# Patient Record
Sex: Female | Born: 1980 | Hispanic: No | Marital: Single | Smoking: Current every day smoker
Health system: Southern US, Community
[De-identification: ages and names within clinical notes are randomized; demographics above are authoritative.]

## PROBLEM LIST (undated history)

## (undated) ENCOUNTER — Inpatient Hospital Stay (HOSPITAL_COMMUNITY): Payer: Self-pay

## (undated) HISTORY — PX: TONSILLECTOMY: SUR1361

## (undated) HISTORY — PX: CHOLECYSTECTOMY: SHX55

---

## 2015-04-03 ENCOUNTER — Emergency Department (HOSPITAL_COMMUNITY): Payer: BLUE CROSS/BLUE SHIELD

## 2015-04-03 ENCOUNTER — Encounter (HOSPITAL_COMMUNITY): Payer: Self-pay | Admitting: Emergency Medicine

## 2015-04-03 ENCOUNTER — Inpatient Hospital Stay (HOSPITAL_COMMUNITY)
Admission: AD | Admit: 2015-04-03 | Discharge: 2015-04-04 | Disposition: A | Discharge status: Other Acute Inpatient Hospital | Payer: BLUE CROSS/BLUE SHIELD | Source: Ambulatory Visit | Attending: Emergency Medicine | Admitting: Emergency Medicine

## 2015-04-03 DIAGNOSIS — Z3490 Encounter for supervision of normal pregnancy, unspecified, unspecified trimester: Secondary | ICD-10-CM

## 2015-04-03 DIAGNOSIS — B9689 Other specified bacterial agents as the cause of diseases classified elsewhere: Secondary | ICD-10-CM

## 2015-04-03 DIAGNOSIS — N760 Acute vaginitis: Secondary | ICD-10-CM

## 2015-04-03 DIAGNOSIS — O009 Unspecified ectopic pregnancy without intrauterine pregnancy: Secondary | ICD-10-CM

## 2015-04-03 DIAGNOSIS — F17210 Nicotine dependence, cigarettes, uncomplicated: Secondary | ICD-10-CM | POA: Diagnosis not present

## 2015-04-03 DIAGNOSIS — O23591 Infection of other part of genital tract in pregnancy, first trimester: Secondary | ICD-10-CM | POA: Diagnosis not present

## 2015-04-03 DIAGNOSIS — O9989 Other specified diseases and conditions complicating pregnancy, childbirth and the puerperium: Secondary | ICD-10-CM | POA: Diagnosis present

## 2015-04-03 DIAGNOSIS — Z79899 Other long term (current) drug therapy: Secondary | ICD-10-CM | POA: Diagnosis not present

## 2015-04-03 DIAGNOSIS — Z9049 Acquired absence of other specified parts of digestive tract: Secondary | ICD-10-CM | POA: Diagnosis not present

## 2015-04-03 DIAGNOSIS — R109 Unspecified abdominal pain: Secondary | ICD-10-CM

## 2015-04-03 DIAGNOSIS — Z3A01 Less than 8 weeks gestation of pregnancy: Secondary | ICD-10-CM | POA: Diagnosis not present

## 2015-04-03 LAB — COMPREHENSIVE METABOLIC PANEL
ALBUMIN: 4.4 g/dL (ref 3.5–5.0)
ALT: 23 U/L (ref 14–54)
AST: 25 U/L (ref 15–41)
Alkaline Phosphatase: 64 U/L (ref 38–126)
Anion gap: 7 (ref 5–15)
BILIRUBIN TOTAL: 0.4 mg/dL (ref 0.3–1.2)
BUN: 16 mg/dL (ref 6–20)
CALCIUM: 8.7 mg/dL — AB (ref 8.9–10.3)
CO2: 27 mmol/L (ref 22–32)
Chloride: 104 mmol/L (ref 101–111)
Creatinine, Ser: 0.63 mg/dL (ref 0.44–1.00)
GFR calc Af Amer: >60 mL/min (ref >60)
GFR calc non Af Amer: >60 mL/min (ref >60)
Glucose, Bld: 107 mg/dL — ABNORMAL HIGH (ref 65–99)
POTASSIUM: 4.1 mmol/L (ref 3.5–5.1)
SODIUM: 138 mmol/L (ref 135–145)
TOTAL PROTEIN: 7.8 g/dL (ref 6.5–8.1)

## 2015-04-03 LAB — URINALYSIS, ROUTINE W REFLEX MICROSCOPIC
Bilirubin Urine: NEGATIVE
Glucose, UA: NEGATIVE mg/dL
Hgb urine dipstick: NEGATIVE
Ketones, ur: NEGATIVE mg/dL
Nitrite: NEGATIVE
Protein, ur: NEGATIVE mg/dL
Specific Gravity, Urine: 1.018 (ref 1.005–1.030)
UROBILINOGEN UA: 0.2 mg/dL (ref 0.0–1.0)
pH: 6 (ref 5.0–8.0)

## 2015-04-03 LAB — CBC
HCT: 37.2 % (ref 36.0–46.0)
HEMOGLOBIN: 12.4 g/dL (ref 12.0–15.0)
MCH: 29.2 pg (ref 26.0–34.0)
MCHC: 33.3 g/dL (ref 30.0–36.0)
MCV: 87.5 fL (ref 78.0–100.0)
PLATELETS: 322 10*3/uL (ref 150–400)
RBC: 4.25 MIL/uL (ref 3.87–5.11)
RDW: 13.9 % (ref 11.5–15.5)
WBC: 10.1 10*3/uL (ref 4.0–10.5)

## 2015-04-03 LAB — WET PREP, GENITAL
TRICH WET PREP: NONE SEEN
YEAST WET PREP: NONE SEEN

## 2015-04-03 LAB — URINE MICROSCOPIC-ADD ON

## 2015-04-03 LAB — LIPASE, BLOOD: LIPASE: 25 U/L (ref 22–51)

## 2015-04-03 LAB — ABO/RH: ABO/RH(D): O POS

## 2015-04-03 LAB — HCG, QUANTITATIVE, PREGNANCY: hCG, Beta Chain, Quant, S: 6063 m[IU]/mL — ABNORMAL HIGH (ref <5)

## 2015-04-03 MED ORDER — ACETAMINOPHEN 325 MG PO TABS
650.0000 mg | ORAL_TABLET | Freq: Once | ORAL | Status: AC
  Administered 2015-04-03: 650 mg via ORAL
  Filled 2015-04-03: qty 2

## 2015-04-03 NOTE — ED Provider Notes (Signed)
CSN: [id number]     Arrival date & time 04/03/15  1843 History   First MD Initiated Contact with Patient 04/03/15 2240     Chief Complaint  Patient presents with  . Abdominal Pain     (Consider location/radiation/quality/duration/timing/severity/associated sxs/prior Treatment) The history is provided by the patient and medical records.    34 year old female with no significant past medical history presenting to the ED for abdominal pain, nausea, and vomiting for the past 3 days. She states she does have abdominal cramping, more localized to her lower abdomen. States her symptoms do seem somewhat worse at night. Patient denies any urinary symptoms. Bowel movements have been normal.  No fever, chills.  She states she did have some vaginal bleeding last week which she thought was her.. She states it was lighter than her regular menstrual usually is. She continues to have some vaginal spotting today. Denies vaginal discharge.  Patient has had 2 prior pregnancies in the past which were electively terminated.  No hx of ectopic.  VSS.  History reviewed. No pertinent past medical history. Past Surgical History  Procedure Laterality Date  . Tonsillectomy    . Cholecystectomy     Family History  Problem Relation Age of Onset  . Hypertension Other    History  Substance Use Topics  . Smoking status: Current Every Day Smoker    Types: Cigarettes  . Smokeless tobacco: Not on file  . Alcohol Use: Yes     Comment: social   OB History    No data available     Review of Systems  Gastrointestinal: Positive for abdominal pain.  All other systems reviewed and are negative.     Allergies  Sulfa antibiotics  Home Medications   Prior to Admission medications   Medication Sig Start Date End Date Taking? Authorizing Provider  buPROPion (WELLBUTRIN SR) 150 MG 12 hr tablet Take 150 mg by mouth 2 (two) times daily.   Yes Historical Provider, MD  loperamide (IMODIUM) 2 MG capsule Take 2 mg by  mouth as needed for diarrhea or loose stools.   Yes Historical Provider, MD  naproxen sodium (ANAPROX) 220 MG tablet Take 220 mg by mouth 2 (two) times daily as needed (pain).   Yes Historical Provider, MD   BP 92/56 mmHg  Pulse 56  Temp(Src) 98.7 F (37.1 C) (Oral)  Resp 18  SpO2 100%  LMP 03/31/2015 (Exact Date)   Physical Exam  Constitutional: She is oriented to person, place, and time. She appears well-developed and well-nourished.  HENT:  Head: Normocephalic and atraumatic.  Mouth/Throat: Oropharynx is clear and moist.  Eyes: Conjunctivae and EOM are normal. Pupils are equal, round, and reactive to light.  Neck: Normal range of motion.  Cardiovascular: Normal rate, regular rhythm and normal heart sounds.   Pulmonary/Chest: Effort normal and breath sounds normal. No respiratory distress. She has no wheezes.  Abdominal: Soft. Bowel sounds are normal. There is tenderness in the right lower quadrant, suprapubic area and left lower quadrant.  Genitourinary: There is no tenderness on the right labia. There is no tenderness on the left labia. Cervix exhibits no motion tenderness. Right adnexum displays no tenderness. Left adnexum displays no tenderness. No bleeding in the vagina. No foreign body around the vagina. Vaginal discharge ( scant) found.  Normal female external genitalia without noted lesions or rashes; scant amount of white vaginal discharge noted; no adnexal or CMT  Musculoskeletal: Normal range of motion.  Neurological: She is alert and oriented to person,  place, and time.  Skin: Skin is warm and dry.  Psychiatric: She has a normal mood and affect.  Nursing note and vitals reviewed.   ED Course  Procedures (including critical care time) Labs Review Labs Reviewed  WET PREP, GENITAL - Abnormal; Notable for the following:    Clue Cells Wet Prep HPF POC FEW (*)    WBC, Wet Prep HPF POC FEW (*)    All other components within normal limits  COMPREHENSIVE METABOLIC PANEL -  Abnormal; Notable for the following:    Glucose, Bld 107 (*)    Calcium 8.7 (*)    All other components within normal limits  URINALYSIS, ROUTINE W REFLEX MICROSCOPIC (NOT AT [facility]) - Abnormal; Notable for the following:    APPearance CLOUDY (*)    Leukocytes, UA SMALL (*)    All other components within normal limits  HCG, QUANTITATIVE, PREGNANCY - Abnormal; Notable for the following:    hCG, Beta Chain, Quant, S 6063 (*)    All other components within normal limits  LIPASE, BLOOD  CBC  URINE MICROSCOPIC-ADD ON  ABO/RH  GC/CHLAMYDIA PROBE AMP ([place]) NOT AT [facility]    Imaging Review No results found.   EKG Interpretation None      MDM   Final diagnoses:  Abdominal pain  Pregnancy  Bacterial vaginosis   34 year old female here with lower abdominal cramping , nausea, and vomiting for the past 3 days. Patient reports menstrual last week which was lighter than normal. She is afebrile, nontoxic. She does have some generalized tenderness throughout her lower abdomen. Labwork as above, hCG 6063 consistent with early pregnancy. Pelvic exam was performed, scant amount of vaginal discharge noted. No adnexal or CMT.  Few clue cells present on wet prep.  Gc/Chl pending.  U/a non-infectious.  U/S will be obtained to establish IUP.  1:04 AM Care signed out to PA Piepenbrink.  Ultrasound pending at this time.  If negative and/or no concerning findings, anticipate patient can be discharged home with treatment for BV, anti-emetics and  Prenatals.  Patient should have close FU at [facility] for ongoing prenatal care.  Garlon Hatchet, PA-C 04/04/15 0105  Courteney Randall An, MD 04/07/15 [phone]

## 2015-04-03 NOTE — ED Notes (Signed)
Bed: WA21 Expected date:  Expected time:  Means of arrival:  Comments: No monitor 

## 2015-04-03 NOTE — ED Notes (Signed)
Pt is c/o abd pain for the past 3 days  Pt states the pain is in the epigastric area and runs all the way down to her lower abdomen  Pt states she has had nausea and vomiting off and on yesterday and today  Pt states pain is worse at night

## 2015-04-04 ENCOUNTER — Encounter (HOSPITAL_COMMUNITY): Payer: Self-pay | Admitting: *Deleted

## 2015-04-04 DIAGNOSIS — O009 Ectopic pregnancy, unspecified: Secondary | ICD-10-CM | POA: Diagnosis not present

## 2015-04-04 LAB — GC/CHLAMYDIA PROBE AMP (~~LOC~~) NOT AT ARMC
CHLAMYDIA, DNA PROBE: NEGATIVE
NEISSERIA GONORRHEA: NEGATIVE

## 2015-04-04 MED ORDER — SODIUM CHLORIDE 0.9 % IV BOLUS (SEPSIS)
1000.0000 mL | Freq: Once | INTRAVENOUS | Status: AC
  Administered 2015-04-04: 1000 mL via INTRAVENOUS

## 2015-04-04 MED ORDER — PRENATAL COMPLETE 14-0.4 MG PO TABS: 1.0000 | ORAL_TABLET | Freq: Every day | ORAL | Status: AC

## 2015-04-04 MED ORDER — OXYCODONE-ACETAMINOPHEN 5-325 MG PO TABS
1.0000 | ORAL_TABLET | Freq: Four times a day (QID) | ORAL | Status: DC | PRN
Start: 2015-04-04 — End: 2015-04-06

## 2015-04-04 MED ORDER — METRONIDAZOLE 0.75 % VA GEL: 1.0000 | Freq: Two times a day (BID) | VAGINAL | Status: AC

## 2015-04-04 MED ORDER — ONDANSETRON 4 MG PO TBDP: 4.0000 mg | ORAL_TABLET | Freq: Three times a day (TID) | ORAL | Status: AC | PRN

## 2015-04-04 MED ORDER — ONDANSETRON HCL 4 MG/2ML IJ SOLN
4.0000 mg | Freq: Once | INTRAMUSCULAR | Status: AC
  Administered 2015-04-04: 4 mg via INTRAVENOUS
  Filled 2015-04-04: qty 2

## 2015-04-04 MED ORDER — HYDROMORPHONE HCL 1 MG/ML IJ SOLN
1.0000 mg | Freq: Once | INTRAMUSCULAR | Status: AC
  Administered 2015-04-04: 1 mg via INTRAVENOUS
  Filled 2015-04-04: qty 1

## 2015-04-04 MED ORDER — MORPHINE SULFATE 4 MG/ML IJ SOLN
4.0000 mg | Freq: Once | INTRAMUSCULAR | Status: AC
  Administered 2015-04-04: 4 mg via INTRAVENOUS
  Filled 2015-04-04: qty 1

## 2015-04-04 NOTE — ED Provider Notes (Signed)
Patient care acquired from Sharilyn Sites, PA-C pending [place] results.  Results for orders placed or performed during the hospital encounter of 04/03/15  Wet prep, genital  Result Value Ref Range   Yeast Wet Prep HPF POC NONE SEEN NONE SEEN   Trich, Wet Prep NONE SEEN NONE SEEN   Clue Cells Wet Prep HPF POC FEW (A) NONE SEEN   WBC, Wet Prep HPF POC FEW (A) NONE SEEN  Lipase, blood  Result Value Ref Range   Lipase 25 22 - 51 U/L  Comprehensive metabolic panel  Result Value Ref Range   Sodium 138 135 - 145 mmol/L   Potassium 4.1 3.5 - 5.1 mmol/L   Chloride 104 101 - 111 mmol/L   CO2 27 22 - 32 mmol/L   Glucose, Bld 107 (H) 65 - 99 mg/dL   BUN 16 6 - 20 mg/dL   Creatinine, Ser 1.61 0.44 - 1.00 mg/dL   Calcium 8.7 (L) 8.9 - 10.3 mg/dL   Total Protein 7.8 6.5 - 8.1 g/dL   Albumin 4.4 3.5 - 5.0 g/dL   AST 25 15 - 41 U/L   ALT 23 14 - 54 U/L   Alkaline Phosphatase 64 38 - 126 U/L   Total Bilirubin 0.4 0.3 - 1.2 mg/dL   GFR calc non Af Amer >60 >60 mL/min   GFR calc Af Amer >60 >60 mL/min   Anion gap 7 5 - 15  CBC  Result Value Ref Range   WBC 10.1 4.0 - 10.5 K/uL   RBC 4.25 3.87 - 5.11 MIL/uL   Hemoglobin 12.4 12.0 - 15.0 g/dL   HCT 09.6 04.5 - 40.9 %   MCV 87.5 78.0 - 100.0 fL   MCH 29.2 26.0 - 34.0 pg   MCHC 33.3 30.0 - 36.0 g/dL   RDW 81.1 91.4 - 78.2 %   Platelets 322 150 - 400 K/uL  Urinalysis, Routine w reflex microscopic (not at [facility])  Result Value Ref Range   Color, Urine YELLOW YELLOW   APPearance CLOUDY (A) CLEAR   Specific Gravity, Urine 1.018 1.005 - 1.030   pH 6.0 5.0 - 8.0   Glucose, UA NEGATIVE NEGATIVE mg/dL   Hgb urine dipstick NEGATIVE NEGATIVE   Bilirubin Urine NEGATIVE NEGATIVE   Ketones, ur NEGATIVE NEGATIVE mg/dL   Protein, ur NEGATIVE NEGATIVE mg/dL   Urobilinogen, UA 0.2 0.0 - 1.0 mg/dL   Nitrite NEGATIVE NEGATIVE   Leukocytes, UA SMALL (A) NEGATIVE  hCG, quantitative, pregnancy  Result Value Ref Range   hCG, Beta Chain, Quant, S 6063 (H) <5  mIU/mL  Urine microscopic-add on  Result Value Ref Range   Squamous Epithelial / LPF RARE RARE   WBC, UA 3-6 <3 WBC/hpf   Bacteria, UA RARE RARE  ABO/Rh  Result Value Ref Range   ABO/RH(D) O POS    US Ob Comp Less 14 Wks  04/04/2015   CLINICAL DATA:  Pregnant patient with pain and vaginal bleeding. Beta HCG 6063.  EXAM: OBSTETRIC <14 WK [place] AND TRANSVAGINAL OB US  TECHNIQUE: Both transabdominal and transvaginal ultrasound examinations were performed for complete evaluation of the gestation as well as the maternal uterus, adnexal regions, and pelvic cul-de-sac. Transvaginal technique was performed to assess early pregnancy.  COMPARISON:  None.  FINDINGS: Intrauterine gestational sac: Not present.  Yolk sac:  Not present.  Embryo:  Not present  Maternal uterus/adnexae: The uterus measures 6.5 x 3.8 x 4.4 cm. Endometrium measures 9.3 mm. There is a complex right adnexal  mass measuring 6.8 x 8.3 x 4.6 cm of heterogeneous mixed echogenicity. There is internal vascularity. Immediately adjacent to this adnexal mass within the right ovary is ankle 4.3 x 4.2 x 4.3 cm are complex cyst with lacy internal echoes and internal septations. The left ovary is not visualized. No definite pelvic free fluid.  IMPRESSION: 1. No intrauterine gestation. 2. Complex right adnexal mass measuring up to 8 cm. In the setting of positive pregnancy test and beta HCG 6063, this is concerning for right adnexal ectopic pregnancy, however not definitive. No definite gestational sac or fetal pole is not seen within the adnexal mass. 3. Complex cyst in the right ovary measuring 4.3 cm. This is adjacent to the complex right adnexal mass. This may reflect a hemorrhagic cyst or endometrioma. These results were called by telephone at the time of interpretation on 04/04/2015 at 1:22 am to PA [facility], who verbally acknowledged these results.   Electronically Signed   By: Rubye Oaks M.D.   On: 04/04/2015 01:23   US Ob  Transvaginal  04/04/2015   CLINICAL DATA:  Pregnant patient with pain and vaginal bleeding. Beta HCG 6063.  EXAM: OBSTETRIC <14 WK [place] AND TRANSVAGINAL OB US  TECHNIQUE: Both transabdominal and transvaginal ultrasound examinations were performed for complete evaluation of the gestation as well as the maternal uterus, adnexal regions, and pelvic cul-de-sac. Transvaginal technique was performed to assess early pregnancy.  COMPARISON:  None.  FINDINGS: Intrauterine gestational sac: Not present.  Yolk sac:  Not present.  Embryo:  Not present  Maternal uterus/adnexae: The uterus measures 6.5 x 3.8 x 4.4 cm. Endometrium measures 9.3 mm. There is a complex right adnexal mass measuring 6.8 x 8.3 x 4.6 cm of heterogeneous mixed echogenicity. There is internal vascularity. Immediately adjacent to this adnexal mass within the right ovary is ankle 4.3 x 4.2 x 4.3 cm are complex cyst with lacy internal echoes and internal septations. The left ovary is not visualized. No definite pelvic free fluid.  IMPRESSION: 1. No intrauterine gestation. 2. Complex right adnexal mass measuring up to 8 cm. In the setting of positive pregnancy test and beta HCG 6063, this is concerning for right adnexal ectopic pregnancy, however not definitive. No definite gestational sac or fetal pole is not seen within the adnexal mass. 3. Complex cyst in the right ovary measuring 4.3 cm. This is adjacent to the complex right adnexal mass. This may reflect a hemorrhagic cyst or endometrioma. These results were called by telephone at the time of interpretation on 04/04/2015 at 1:22 am to PA [facility], who verbally acknowledged these results.   Electronically Signed   By: Rubye Oaks M.D.   On: 04/04/2015 01:23     1:37 AM Discussed patient with Dr. Jolayne Panther of [facility] who will accept the patient in transfer.   1. Ectopic pregnancy   2. Abdominal pain   3. Pregnancy   4. Bacterial vaginosis      I have reviewed nursing notes,  vital signs, and all lab and all imaging results as noted above.  Patient with ultrasound concerning for right sided ectopic pregnancy. Discussed results with patient who remains hemodynamically stable. IVF, pain medication will be administered and patient will be transferred to [place] for further management and evaluation. Patient d/w with Dr. Norlene Campbell, agrees with plan.    Francee Piccolo, PA-C 04/04/15 [id number]  Marisa Severin, MD 04/04/15 [phone]

## 2015-04-04 NOTE — Discharge Instructions (Signed)
Ectopic Pregnancy °An ectopic pregnancy happens when a fertilized egg grows outside the uterus. A pregnancy cannot live outside of the uterus. This problem often happens in the fallopian tube. It is often caused by damage to the fallopian tube. °If this problem is found early, you may be treated with medicine. If your tube tears or bursts open (ruptures), you will bleed inside. This is an emergency. You will need surgery. Get help right away.  °SYMPTOMS °You may have normal pregnancy symptoms at first. These include: °· Missing your period. °· Feeling sick to your stomach (nauseous). °· Being tired. °· Having tender breasts. °Then, you may start to have symptoms that are not normal. These include: °· Pain with sex (intercourse). °· Bleeding from the vagina. This includes light bleeding (spotting). °· Belly (abdomen) or lower belly cramping or pain. This may be felt on one side. °· A fast heartbeat (pulse). °· Passing out (fainting) after going poop (bowel movement). °If your tube tears, you may have symptoms such as: °· Really bad pain in the belly or lower belly. This happens suddenly. °· Dizziness. °· Passing out. °· Shoulder pain. °GET HELP RIGHT AWAY IF:  °You have any of these symptoms. This is an emergency. °MAKE SURE YOU: °· Understand these instructions. °· Will watch your condition. °· Will get help right away if you are not doing well or get worse. °Document Released: 11/21/2008 Document Revised: 08/30/2013 Document Reviewed: 04/06/2013 °[employer]® Patient Information ©2015 [employer]. This information is not intended to replace advice given to you by your health care provider. Make sure you discuss any questions you have with your health care provider. ° °

## 2015-04-04 NOTE — MAU Note (Signed)
IV out and cathetar intact, site is clean.

## 2015-04-04 NOTE — ED Notes (Signed)
[facility]

## 2015-04-04 NOTE — MAU Note (Signed)
Received pt from [facility] via [facility]. IV saline lock insitu in r antecubital site.

## 2015-04-04 NOTE — MAU Provider Note (Addendum)
History     CSN: [id number]  Arrival date and time: 04/03/15 1843   First Provider Initiated Contact with Patient 04/04/15 0440      Chief Complaint  Patient presents with  . Abdominal Pain   HPI Kimberly Carson is a 34 y.o. G2P0010 at [redacted]w[redacted]d who presents to MAU today with complaint of RLQ abdominal pain and vaginal bleeding. The patient was transferred from [facility] with concern for possible ectopic pregnancy. The patient states that pain has been severe x 3 days. She rates pain now a 5/10. She was given Morphine at 0200 for pain prior to transfer from [facility]. She states that bleeding started ~ 1 week ago. She states that it was light and got a little heavier. Today she states minimal bleeding requiring only a panty liner. She denies N/V or fever.   OB History    Gravida Para Term Preterm AB TAB SAB Ectopic Multiple Living   2 0 0 0 1 1 0   0      History reviewed. No pertinent past medical history.  Past Surgical History  Procedure Laterality Date  . Tonsillectomy    . Cholecystectomy      Family History  Problem Relation Age of Onset  . Hypertension Other     History  Substance Use Topics  . Smoking status: Current Every Day Smoker    Types: Cigarettes  . Smokeless tobacco: Not on file  . Alcohol Use: Yes     Comment: social    Allergies:  Allergies  Allergen Reactions  . Sulfa Antibiotics Hives    Prescriptions prior to admission  Medication Sig Dispense Refill Last Dose  . buPROPion (WELLBUTRIN SR) 150 MG 12 hr tablet Take 150 mg by mouth 2 (two) times daily.   Past Week at Unknown time  . loperamide (IMODIUM) 2 MG capsule Take 2 mg by mouth as needed for diarrhea or loose stools.   04/02/2015 at Unknown time  . naproxen sodium (ANAPROX) 220 MG tablet Take 220 mg by mouth 2 (two) times daily as needed (pain).   04/03/2015 at Unknown time    Review of Systems  Constitutional: Negative for fever and malaise/fatigue.  Gastrointestinal: Positive for abdominal pain.  Negative for nausea and vomiting.  Genitourinary:       + vaginal bleeding Neg - vaginal discharge   Physical Exam   Blood pressure 89/47, pulse 67, temperature 98.7 F (37.1 C), temperature source Oral, resp. rate 16, last menstrual period 03/24/2015, SpO2 99 %.  Physical Exam  Nursing note and vitals reviewed. Constitutional: She is oriented to person, place, and time. She appears well-developed and well-nourished. No distress.  HENT:  Head: Normocephalic and atraumatic.  Cardiovascular: Normal rate.   Respiratory: Effort normal.  GI: Soft. She exhibits no distension and no mass. There is tenderness (mild tenderness to palpation of the RLQ). There is no rebound and no guarding.  Neurological: She is alert and oriented to person, place, and time.  Skin: Skin is warm and dry. No erythema.  Psychiatric: She has a normal mood and affect.   Results for orders placed or performed during the hospital encounter of 04/03/15 (from the past 24 hour(s))  Lipase, blood     Status: None   Collection Time: 04/03/15  7:43 PM  Result Value Ref Range   Lipase 25 22 - 51 U/L  Comprehensive metabolic panel     Status: Abnormal   Collection Time: 04/03/15  7:43 PM  Result Value Ref  Range   Sodium 138 135 - 145 mmol/L   Potassium 4.1 3.5 - 5.1 mmol/L   Chloride 104 101 - 111 mmol/L   CO2 27 22 - 32 mmol/L   Glucose, Bld 107 (H) 65 - 99 mg/dL   BUN 16 6 - 20 mg/dL   Creatinine, Ser 1.61 0.44 - 1.00 mg/dL   Calcium 8.7 (L) 8.9 - 10.3 mg/dL   Total Protein 7.8 6.5 - 8.1 g/dL   Albumin 4.4 3.5 - 5.0 g/dL   AST 25 15 - 41 U/L   ALT 23 14 - 54 U/L   Alkaline Phosphatase 64 38 - 126 U/L   Total Bilirubin 0.4 0.3 - 1.2 mg/dL   GFR calc non Af Amer >60 >60 mL/min   GFR calc Af Amer >60 >60 mL/min   Anion gap 7 5 - 15  CBC     Status: None   Collection Time: 04/03/15  7:43 PM  Result Value Ref Range   WBC 10.1 4.0 - 10.5 K/uL   RBC 4.25 3.87 - 5.11 MIL/uL   Hemoglobin 12.4 12.0 - 15.0 g/dL    HCT 09.6 04.5 - 40.9 %   MCV 87.5 78.0 - 100.0 fL   MCH 29.2 26.0 - 34.0 pg   MCHC 33.3 30.0 - 36.0 g/dL   RDW 81.1 91.4 - 78.2 %   Platelets 322 150 - 400 K/uL  hCG, quantitative, pregnancy     Status: Abnormal   Collection Time: 04/03/15  7:43 PM  Result Value Ref Range   hCG, Beta Chain, Quant, S 6063 (H) <5 mIU/mL  Urinalysis, Routine w reflex microscopic (not at [facility])     Status: Abnormal   Collection Time: 04/03/15 10:52 PM  Result Value Ref Range   Color, Urine YELLOW YELLOW   APPearance CLOUDY (A) CLEAR   Specific Gravity, Urine 1.018 1.005 - 1.030   pH 6.0 5.0 - 8.0   Glucose, UA NEGATIVE NEGATIVE mg/dL   Hgb urine dipstick NEGATIVE NEGATIVE   Bilirubin Urine NEGATIVE NEGATIVE   Ketones, ur NEGATIVE NEGATIVE mg/dL   Protein, ur NEGATIVE NEGATIVE mg/dL   Urobilinogen, UA 0.2 0.0 - 1.0 mg/dL   Nitrite NEGATIVE NEGATIVE   Leukocytes, UA SMALL (A) NEGATIVE  Urine microscopic-add on     Status: None   Collection Time: 04/03/15 10:52 PM  Result Value Ref Range   Squamous Epithelial / LPF RARE RARE   WBC, UA 3-6 <3 WBC/hpf   Bacteria, UA RARE RARE  Wet prep, genital     Status: Abnormal   Collection Time: 04/03/15 11:10 PM  Result Value Ref Range   Yeast Wet Prep HPF POC NONE SEEN NONE SEEN   Trich, Wet Prep NONE SEEN NONE SEEN   Clue Cells Wet Prep HPF POC FEW (A) NONE SEEN   WBC, Wet Prep HPF POC FEW (A) NONE SEEN  ABO/Rh     Status: None   Collection Time: 04/03/15 11:16 PM  Result Value Ref Range   ABO/RH(D) O POS     US Ob Comp Less 14 Wks  04/04/2015   CLINICAL DATA:  Pregnant patient with pain and vaginal bleeding. Beta HCG 6063.  EXAM: OBSTETRIC <14 WK [place] AND TRANSVAGINAL OB US  TECHNIQUE: Both transabdominal and transvaginal ultrasound examinations were performed for complete evaluation of the gestation as well as the maternal uterus, adnexal regions, and pelvic cul-de-sac. Transvaginal technique was performed to assess early pregnancy.  COMPARISON:  None.   FINDINGS: Intrauterine gestational sac:  Not present.  Yolk sac:  Not present.  Embryo:  Not present  Maternal uterus/adnexae: The uterus measures 6.5 x 3.8 x 4.4 cm. Endometrium measures 9.3 mm. There is a complex right adnexal mass measuring 6.8 x 8.3 x 4.6 cm of heterogeneous mixed echogenicity. There is internal vascularity. Immediately adjacent to this adnexal mass within the right ovary is ankle 4.3 x 4.2 x 4.3 cm are complex cyst with lacy internal echoes and internal septations. The left ovary is not visualized. No definite pelvic free fluid.  IMPRESSION: 1. No intrauterine gestation. 2. Complex right adnexal mass measuring up to 8 cm. In the setting of positive pregnancy test and beta HCG 6063, this is concerning for right adnexal ectopic pregnancy, however not definitive. No definite gestational sac or fetal pole is not seen within the adnexal mass. 3. Complex cyst in the right ovary measuring 4.3 cm. This is adjacent to the complex right adnexal mass. This may reflect a hemorrhagic cyst or endometrioma. These results were called by telephone at the time of interpretation on 04/04/2015 at 1:22 am to PA [facility] who verbally acknowledged these results.   Electronically Signed   By: Rubye Oaks M.D.   On: 04/04/2015 01:23   US Ob Transvaginal  04/04/2015   CLINICAL DATA:  Pregnant patient with pain and vaginal bleeding. Beta HCG 6063.  EXAM: OBSTETRIC <14 WK [place] AND TRANSVAGINAL OB US  TECHNIQUE: Both transabdominal and transvaginal ultrasound examinations were performed for complete evaluation of the gestation as well as the maternal uterus, adnexal regions, and pelvic cul-de-sac. Transvaginal technique was performed to assess early pregnancy.  COMPARISON:  None.  FINDINGS: Intrauterine gestational sac: Not present.  Yolk sac:  Not present.  Embryo:  Not present  Maternal uterus/adnexae: The uterus measures 6.5 x 3.8 x 4.4 cm. Endometrium measures 9.3 mm. There is a complex right adnexal mass  measuring 6.8 x 8.3 x 4.6 cm of heterogeneous mixed echogenicity. There is internal vascularity. Immediately adjacent to this adnexal mass within the right ovary is ankle 4.3 x 4.2 x 4.3 cm are complex cyst with lacy internal echoes and internal septations. The left ovary is not visualized. No definite pelvic free fluid.  IMPRESSION: 1. No intrauterine gestation. 2. Complex right adnexal mass measuring up to 8 cm. In the setting of positive pregnancy test and beta HCG 6063, this is concerning for right adnexal ectopic pregnancy, however not definitive. No definite gestational sac or fetal pole is not seen within the adnexal mass. 3. Complex cyst in the right ovary measuring 4.3 cm. This is adjacent to the complex right adnexal mass. This may reflect a hemorrhagic cyst or endometrioma. These results were called by telephone at the time of interpretation on 04/04/2015 at 1:22 am to PA [facility], who verbally acknowledged these results.   Electronically Signed   By: Rubye Oaks M.D.   On: 04/04/2015 01:23    MAU Course  Procedures None  MDM All labs and imaging from [facility] evaluation reviewed Dr. Jolayne Panther has reviewed [place] images for this patient- unclear if it is ectopic pregnancy vs ovarian cyst. As patient is stable she would warrant 48 hour follow-up quant hCG.  Discussed plan for follow-up with patient. Patient states that she was supposed to fly home to [facility] tonight. Patient is advised of the possible outcomes and when to seek medical attention. Patient is advised that she should consider staying in [place] at least until we have follow-up in 2 days. Patient unsure what she  will do for follow-up at this time.  Assessment and Plan  A: Positive pregnancy test Abdominal pain in pregnancy Ovarian cyst Pregnancy of unknown location  P: Discharge home Rx for Percocet given to patient Ectopic precautions discussed Patient advised to follow-up in MAU in 48 hours for repeat labs or sooner if  her condition were to change or worsen   Marny Lowenstein, PA-C  04/04/2015, 4:40 AM    Patient reports being approximately 4 weeks by LMP. No IUP seen on ultrasound but rather an 8 cm right complex adnexal mass suspicious for ectopic pregnancy vs ovarian cyst. Patient hemodynamically stable with mild tenderness in right adnexal region. Ectopic pregnancy precautions reviewed. Advised patient to follow up in 48 hours for a repeat quant HCG

## 2015-04-05 ENCOUNTER — Inpatient Hospital Stay (HOSPITAL_COMMUNITY): Payer: BLUE CROSS/BLUE SHIELD

## 2015-04-05 ENCOUNTER — Encounter (HOSPITAL_COMMUNITY): Payer: Self-pay | Admitting: *Deleted

## 2015-04-05 ENCOUNTER — Inpatient Hospital Stay (HOSPITAL_COMMUNITY): Payer: BLUE CROSS/BLUE SHIELD | Admitting: Anesthesiology

## 2015-04-05 ENCOUNTER — Ambulatory Visit (HOSPITAL_COMMUNITY)
Admission: AD | Admit: 2015-04-05 | Discharge: 2015-04-06 | Disposition: A | Discharge status: Home / Self Care | Payer: BLUE CROSS/BLUE SHIELD | Source: Ambulatory Visit | Attending: Family Medicine | Admitting: Family Medicine

## 2015-04-05 ENCOUNTER — Encounter (HOSPITAL_COMMUNITY)
Admission: AD | Disposition: A | Discharge status: Home / Self Care | Payer: Self-pay | Source: Ambulatory Visit | Attending: Family Medicine

## 2015-04-05 DIAGNOSIS — F17210 Nicotine dependence, cigarettes, uncomplicated: Secondary | ICD-10-CM | POA: Diagnosis not present

## 2015-04-05 DIAGNOSIS — Z79899 Other long term (current) drug therapy: Secondary | ICD-10-CM | POA: Diagnosis not present

## 2015-04-05 DIAGNOSIS — O001 Tubal pregnancy: Secondary | ICD-10-CM | POA: Insufficient documentation

## 2015-04-05 DIAGNOSIS — IMO0002 Reserved for concepts with insufficient information to code with codable children: Secondary | ICD-10-CM | POA: Diagnosis present

## 2015-04-05 DIAGNOSIS — Z91013 Allergy to seafood: Secondary | ICD-10-CM | POA: Diagnosis not present

## 2015-04-05 DIAGNOSIS — Z882 Allergy status to sulfonamides status: Secondary | ICD-10-CM | POA: Insufficient documentation

## 2015-04-05 DIAGNOSIS — O009 Unspecified ectopic pregnancy without intrauterine pregnancy: Secondary | ICD-10-CM | POA: Diagnosis present

## 2015-04-05 DIAGNOSIS — N8320 Unspecified ovarian cysts: Secondary | ICD-10-CM | POA: Diagnosis not present

## 2015-04-05 HISTORY — PX: DIAGNOSTIC LAPAROSCOPY WITH REMOVAL OF ECTOPIC PREGNANCY: SHX6449

## 2015-04-05 LAB — CBC
HEMATOCRIT: 33.8 % — AB (ref 36.0–46.0)
Hemoglobin: 11.6 g/dL — ABNORMAL LOW (ref 12.0–15.0)
MCH: 30.1 pg (ref 26.0–34.0)
MCHC: 34.3 g/dL (ref 30.0–36.0)
MCV: 87.6 fL (ref 78.0–100.0)
PLATELETS: 298 10*3/uL (ref 150–400)
RBC: 3.86 MIL/uL — ABNORMAL LOW (ref 3.87–5.11)
RDW: 14.1 % (ref 11.5–15.5)
WBC: 9.2 10*3/uL (ref 4.0–10.5)

## 2015-04-05 LAB — HCG, QUANTITATIVE, PREGNANCY: hCG, Beta Chain, Quant, S: 7458 m[IU]/mL — ABNORMAL HIGH (ref <5)

## 2015-04-05 SURGERY — LAPAROSCOPY, WITH ECTOPIC PREGNANCY SURGICAL TREATMENT
Anesthesia: General | Site: Abdomen

## 2015-04-05 MED ORDER — SCOPOLAMINE 1 MG/3DAYS TD PT72
MEDICATED_PATCH | TRANSDERMAL | Status: AC
Start: 2015-04-05 — End: 2015-04-05
  Filled 2015-04-05: qty 1

## 2015-04-05 MED ORDER — PROPOFOL 10 MG/ML IV BOLUS
INTRAVENOUS | Status: AC
  Filled 2015-04-05: qty 20

## 2015-04-05 MED ORDER — MIDAZOLAM HCL 2 MG/2ML IJ SOLN
INTRAMUSCULAR | Status: AC
  Filled 2015-04-05: qty 2

## 2015-04-05 MED ORDER — CITRIC ACID-SODIUM CITRATE 334-500 MG/5ML PO SOLN
30.0000 mL | Freq: Once | ORAL | Status: AC
  Administered 2015-04-05: 30 mL via ORAL
  Filled 2015-04-05: qty 15

## 2015-04-05 MED ORDER — FAMOTIDINE IN NACL 20-0.9 MG/50ML-% IV SOLN
20.0000 mg | Freq: Once | INTRAVENOUS | Status: AC
  Administered 2015-04-05: 20 mg via INTRAVENOUS
  Filled 2015-04-05: qty 50

## 2015-04-05 MED ORDER — DEXAMETHASONE SODIUM PHOSPHATE 10 MG/ML IJ SOLN
INTRAMUSCULAR | Status: AC
  Filled 2015-04-05: qty 1

## 2015-04-05 MED ORDER — ONDANSETRON HCL 4 MG/2ML IJ SOLN
INTRAMUSCULAR | Status: AC
  Filled 2015-04-05: qty 2

## 2015-04-05 MED ORDER — SUCCINYLCHOLINE CHLORIDE 200 MG/10ML IV SOSY
PREFILLED_SYRINGE | INTRAVENOUS | Status: DC | PRN
  Administered 2015-04-05: 80 mg via INTRAVENOUS

## 2015-04-05 MED ORDER — BUPIVACAINE HCL (PF) 0.25 % IJ SOLN
INTRAMUSCULAR | Status: DC | PRN
  Administered 2015-04-05: 10 mL

## 2015-04-05 MED ORDER — DEXAMETHASONE SODIUM PHOSPHATE 10 MG/ML IJ SOLN
INTRAMUSCULAR | Status: DC | PRN
  Administered 2015-04-05: 10 mg via INTRAVENOUS

## 2015-04-05 MED ORDER — NEOSTIGMINE METHYLSULFATE 10 MG/10ML IV SOLN
INTRAVENOUS | Status: DC | PRN
  Administered 2015-04-05: 2.5 mg via INTRAVENOUS

## 2015-04-05 MED ORDER — HYDROCODONE-ACETAMINOPHEN 7.5-325 MG PO TABS: 1.0000 | ORAL_TABLET | Freq: Once | ORAL | Status: AC | PRN

## 2015-04-05 MED ORDER — LIDOCAINE HCL (CARDIAC) 20 MG/ML IV SOLN
INTRAVENOUS | Status: AC
  Filled 2015-04-05: qty 5

## 2015-04-05 MED ORDER — KETOROLAC TROMETHAMINE 30 MG/ML IJ SOLN
INTRAMUSCULAR | Status: AC
  Filled 2015-04-05: qty 1

## 2015-04-05 MED ORDER — MIDAZOLAM HCL 5 MG/5ML IJ SOLN
INTRAMUSCULAR | Status: DC | PRN
  Administered 2015-04-05: 2 mg via INTRAVENOUS

## 2015-04-05 MED ORDER — SUCCINYLCHOLINE CHLORIDE 20 MG/ML IJ SOLN
INTRAMUSCULAR | Status: AC
  Filled 2015-04-05: qty 1

## 2015-04-05 MED ORDER — LACTATED RINGERS IV SOLN
INTRAVENOUS | Status: DC | PRN
  Administered 2015-04-05 (×2): via INTRAVENOUS

## 2015-04-05 MED ORDER — BUPIVACAINE HCL (PF) 0.25 % IJ SOLN
INTRAMUSCULAR | Status: AC
  Filled 2015-04-05: qty 30

## 2015-04-05 MED ORDER — FENTANYL CITRATE (PF) 100 MCG/2ML IJ SOLN
INTRAMUSCULAR | Status: DC | PRN
  Administered 2015-04-05: 50 ug via INTRAVENOUS
  Administered 2015-04-05: 100 ug via INTRAVENOUS
  Administered 2015-04-05 (×2): 50 ug via INTRAVENOUS

## 2015-04-05 MED ORDER — LACTATED RINGERS IR SOLN
Status: DC | PRN
  Administered 2015-04-05: 3000 mL

## 2015-04-05 MED ORDER — HYDROMORPHONE HCL 1 MG/ML IJ SOLN: 0.2500 mg | INTRAMUSCULAR | Status: DC | PRN

## 2015-04-05 MED ORDER — GLYCOPYRROLATE 0.2 MG/ML IJ SOLN
INTRAMUSCULAR | Status: DC | PRN
  Administered 2015-04-05: .5 mg via INTRAVENOUS

## 2015-04-05 MED ORDER — METOCLOPRAMIDE HCL 5 MG/ML IJ SOLN
10.0000 mg | Freq: Once | INTRAMUSCULAR | Status: AC
  Administered 2015-04-05: 10 mg via INTRAVENOUS
  Filled 2015-04-05: qty 2

## 2015-04-05 MED ORDER — GLYCOPYRROLATE 0.2 MG/ML IJ SOLN
INTRAMUSCULAR | Status: AC
  Filled 2015-04-05: qty 4

## 2015-04-05 MED ORDER — FENTANYL CITRATE (PF) 250 MCG/5ML IJ SOLN
INTRAMUSCULAR | Status: AC
  Filled 2015-04-05: qty 5

## 2015-04-05 MED ORDER — MEPERIDINE HCL 25 MG/ML IJ SOLN: 6.2500 mg | INTRAMUSCULAR | Status: DC | PRN

## 2015-04-05 MED ORDER — SCOPOLAMINE 1 MG/3DAYS TD PT72
MEDICATED_PATCH | TRANSDERMAL | Status: DC | PRN
  Administered 2015-04-05: 1 via TRANSDERMAL

## 2015-04-05 MED ORDER — ROCURONIUM BROMIDE 100 MG/10ML IV SOLN
INTRAVENOUS | Status: DC | PRN
  Administered 2015-04-05: 5 mg via INTRAVENOUS
  Administered 2015-04-05: 10 mg via INTRAVENOUS
  Administered 2015-04-05: 5 mg via INTRAVENOUS
  Administered 2015-04-05: 15 mg via INTRAVENOUS

## 2015-04-05 MED ORDER — ONDANSETRON HCL 4 MG/2ML IJ SOLN
INTRAMUSCULAR | Status: DC | PRN
  Administered 2015-04-05: 4 mg via INTRAVENOUS

## 2015-04-05 MED ORDER — LIDOCAINE HCL (CARDIAC) 20 MG/ML IV SOLN
INTRAVENOUS | Status: DC | PRN
  Administered 2015-04-05: 60 mg via INTRAVENOUS

## 2015-04-05 MED ORDER — PROPOFOL 10 MG/ML IV BOLUS
INTRAVENOUS | Status: DC | PRN
  Administered 2015-04-05: 150 mg via INTRAVENOUS

## 2015-04-05 MED ORDER — METOCLOPRAMIDE HCL 5 MG/ML IJ SOLN: 10.0000 mg | Freq: Once | INTRAMUSCULAR | Status: AC | PRN

## 2015-04-05 MED ORDER — KETOROLAC TROMETHAMINE 30 MG/ML IJ SOLN
INTRAMUSCULAR | Status: DC | PRN
  Administered 2015-04-05: 30 mg via INTRAVENOUS

## 2015-04-05 MED ORDER — NEOSTIGMINE METHYLSULFATE 10 MG/10ML IV SOLN
INTRAVENOUS | Status: AC
  Filled 2015-04-05: qty 1

## 2015-04-05 SURGICAL SUPPLY — 25 items
CHLORAPREP W/TINT 26ML (MISCELLANEOUS) ×3 IMPLANT
CLOTH BEACON ORANGE TIMEOUT ST (SAFETY) ×3 IMPLANT
DECANTER SPIKE VIAL GLASS SM (MISCELLANEOUS) ×3 IMPLANT
DRSG COVADERM PLUS 2X2 (GAUZE/BANDAGES/DRESSINGS) ×6 IMPLANT
DRSG OPSITE POSTOP 3X4 (GAUZE/BANDAGES/DRESSINGS) ×3 IMPLANT
GLOVE BIOGEL PI IND STRL 7.0 (GLOVE) ×1 IMPLANT
GLOVE BIOGEL PI INDICATOR 7.0 (GLOVE) ×2
GLOVE ECLIPSE 7.0 STRL STRAW (GLOVE) ×3 IMPLANT
GLOVE INDICATOR 7.0 STRL GRN (GLOVE) ×12 IMPLANT
GOWN STRL REUS W/TWL LRG LVL3 (GOWN DISPOSABLE) ×9 IMPLANT
LIQUID BAND (GAUZE/BANDAGES/DRESSINGS) ×6 IMPLANT
PACK LAPAROSCOPY BASIN (CUSTOM PROCEDURE TRAY) ×3 IMPLANT
PAD POSITIONER PINK NONSTERILE (MISCELLANEOUS) ×3 IMPLANT
SET IRRIG TUBING LAPAROSCOPIC (IRRIGATION / IRRIGATOR) ×3 IMPLANT
SHEARS HARMONIC ACE PLUS 36CM (ENDOMECHANICALS) ×3 IMPLANT
SUT VIC AB 3-0 X1 27 (SUTURE) ×3 IMPLANT
SUT VICRYL 0 UR6 27IN ABS (SUTURE) ×6 IMPLANT
SUT VICRYL 4-0 PS2 18IN ABS (SUTURE) ×3 IMPLANT
TOWEL OR 17X24 6PK STRL BLUE (TOWEL DISPOSABLE) ×6 IMPLANT
TRAY FOLEY CATH SILVER 14FR (SET/KITS/TRAYS/PACK) ×3 IMPLANT
TROCAR BALLN 12MMX100 BLUNT (TROCAR) ×3 IMPLANT
TROCAR OPTI TIP 5M 100M (ENDOMECHANICALS) ×6 IMPLANT
TROCAR XCEL NON-BLD 11X100MML (ENDOMECHANICALS) ×3 IMPLANT
WARMER LAPAROSCOPE (MISCELLANEOUS) ×3 IMPLANT
WATER STERILE IRR 1000ML POUR (IV SOLUTION) ×3 IMPLANT

## 2015-04-05 NOTE — MAU Provider Note (Signed)
History     CSN: [id number]  Arrival date and time: 04/05/15 1807   First Provider Initiated Contact with Patient 04/05/15 2025      Chief Complaint  Patient presents with  . Follow-up   HPI Comments: Kimberly Carson is 34 y.o. G2P0010 at [redacted]w[redacted]d who presents today for FU HCG. She denies any vaginal bleeding at this time.   Patient is a 34 y.o. female presenting with abdominal pain. The history is provided by the patient.  Abdominal Pain The primary symptoms of the illness include abdominal pain. The primary symptoms of the illness do not include fever, nausea, vomiting, diarrhea or dysuria.  The abdominal pain began more than 2 days ago. The pain came on gradually. The abdominal pain has been unchanged since its onset. The abdominal pain is located in the RLQ. The abdominal pain does not radiate. The severity of the abdominal pain is 10/10. The abdominal pain is relieved by nothing.  Associated with: early pregnancy  The patient states that she believes she is currently pregnant. Symptoms associated with the illness do not include constipation, urgency or frequency.    No past medical history on file.  Past Surgical History  Procedure Laterality Date  . Tonsillectomy    . Cholecystectomy      Family History  Problem Relation Age of Onset  . Hypertension Other     History  Substance Use Topics  . Smoking status: Current Every Day Smoker    Types: Cigarettes  . Smokeless tobacco: Not on file  . Alcohol Use: Yes     Comment: social    Allergies:  Allergies  Allergen Reactions  . Sulfa Antibiotics Hives    Prescriptions prior to admission  Medication Sig Dispense Refill Last Dose  . buPROPion (WELLBUTRIN SR) 150 MG 12 hr tablet Take 150 mg by mouth 2 (two) times daily.   Past Week at Unknown time  . loperamide (IMODIUM) 2 MG capsule Take 2 mg by mouth as needed for diarrhea or loose stools.   04/02/2015 at Unknown time  . metroNIDAZOLE (METROGEL VAGINAL) 0.75 % vaginal gel  Place 1 Applicatorful vaginally 2 (two) times daily. 70 g 0   . ondansetron (ZOFRAN ODT) 4 MG disintegrating tablet Take 1 tablet (4 mg total) by mouth every 8 (eight) hours as needed for nausea. 10 tablet 0   . oxyCODONE-acetaminophen (PERCOCET/ROXICET) 5-325 MG per tablet Take 1 tablet by mouth every 6 (six) hours as needed for severe pain. 10 tablet 0   . Prenatal Vit-Fe Fumarate-FA (PRENATAL COMPLETE) 14-0.4 MG TABS Take 1 tablet by mouth daily. 60 each 0     Review of Systems  Constitutional: Negative for fever.  Gastrointestinal: Positive for abdominal pain. Negative for nausea, vomiting, diarrhea and constipation.  Genitourinary: Negative for dysuria, urgency and frequency.   Physical Exam   Last menstrual period 03/24/2015.  Physical Exam  Nursing note and vitals reviewed. Constitutional: She is oriented to person, place, and time. She appears well-developed and well-nourished. No distress.  HENT:  Head: Normocephalic.  Cardiovascular: Normal rate.   Respiratory: Effort normal.  GI: Soft. There is no tenderness.  Neurological: She is alert and oriented to person, place, and time.  Skin: Skin is warm and dry.  Psychiatric: She has a normal mood and affect.   Results for Kimberly Carson (MRN [id number]) as of 04/05/2015 21:24  Ref. Range 04/03/2015 19:43 04/03/2015 23:16 04/05/2015 18:50  HCG, Beta Chain, Quant, S Latest Ref Range: <5 mIU/mL 6063 (H)  7458 (H)    .US Ob Comp Less 14 Wks  04/04/2015   CLINICAL DATA:  Pregnant patient with pain and vaginal bleeding. Beta HCG 6063.  EXAM: OBSTETRIC <14 WK [place] AND TRANSVAGINAL OB US  TECHNIQUE: Both transabdominal and transvaginal ultrasound examinations were performed for complete evaluation of the gestation as well as the maternal uterus, adnexal regions, and pelvic cul-de-sac. Transvaginal technique was performed to assess early pregnancy.  COMPARISON:  None.  FINDINGS: Intrauterine gestational sac: Not present.  Yolk sac:  Not present.   Embryo:  Not present  Maternal uterus/adnexae: The uterus measures 6.5 x 3.8 x 4.4 cm. Endometrium measures 9.3 mm. There is a complex right adnexal mass measuring 6.8 x 8.3 x 4.6 cm of heterogeneous mixed echogenicity. There is internal vascularity. Immediately adjacent to this adnexal mass within the right ovary is ankle 4.3 x 4.2 x 4.3 cm are complex cyst with lacy internal echoes and internal septations. The left ovary is not visualized. No definite pelvic free fluid.  IMPRESSION: 1. No intrauterine gestation. 2. Complex right adnexal mass measuring up to 8 cm. In the setting of positive pregnancy test and beta HCG 6063, this is concerning for right adnexal ectopic pregnancy, however not definitive. No definite gestational sac or fetal pole is not seen within the adnexal mass. 3. Complex cyst in the right ovary measuring 4.3 cm. This is adjacent to the complex right adnexal mass. This may reflect a hemorrhagic cyst or endometrioma. These results were called by telephone at the time of interpretation on 04/04/2015 at 1:22 am to PA [facility], who verbally acknowledged these results.   Electronically Signed   By: Rubye Oaks M.D.   On: 04/04/2015 01:23   US Ob Transvaginal  04/05/2015   CLINICAL DATA:  Right lower quadrant pelvic pain, possible ectopic, beta HCG 7,458  EXAM: TRANSVAGINAL OB ULTRASOUND  TECHNIQUE: Transvaginal ultrasound was performed for complete evaluation of the gestation as well as the maternal uterus, adnexal regions, and pelvic cul-de-sac.  COMPARISON:  04/04/2015  FINDINGS: Intrauterine gestational sac: Not visualized  Yolk sac:  Not visualized  Embryo:  Not visualized  Cardiac Activity: Not detected  Maternal uterus/adnexae: Uterus is normal in size. No intrauterine pregnancy demonstrated. Normal appearing endometrium. Persistent complex right adnexal mass measuring 4.4 x 3.4 x 3.4 cm with internal irregular hypoechoic fluid. No significant surrounding hypervascularity.  Interval development of a small amount of complex pelvic free fluid. Appearance remains concerning for a right ectopic pregnancy . The new complex pelvic free fluid is concerning for rupture.  Left ovary contains a complex septated cyst measuring 5 x 3.7 x 4.2 cm.  IMPRESSION: No intrauterine pregnancy visualized.  Persistent complex right adnexal mass concerning for ectopic pregnancy. New complex pelvic free fluid suggest rupture.  Persistent complex left ovarian cyst  These results were called by telephone at the time of interpretation on 04/05/2015 at 9:28 pm to. HEATHER HOGAN , who verbally acknowledged these results.   Electronically Signed   By: Judie Petit.  Shick M.D.   On: 04/05/2015 21:29   US Ob Transvaginal  04/04/2015   CLINICAL DATA:  Pregnant patient with pain and vaginal bleeding. Beta HCG 6063.  EXAM: OBSTETRIC <14 WK [place] AND TRANSVAGINAL OB US  TECHNIQUE: Both transabdominal and transvaginal ultrasound examinations were performed for complete evaluation of the gestation as well as the maternal uterus, adnexal regions, and pelvic cul-de-sac. Transvaginal technique was performed to assess early pregnancy.  COMPARISON:  None.  FINDINGS: Intrauterine gestational sac: Not  present.  Yolk sac:  Not present.  Embryo:  Not present  Maternal uterus/adnexae: The uterus measures 6.5 x 3.8 x 4.4 cm. Endometrium measures 9.3 mm. There is a complex right adnexal mass measuring 6.8 x 8.3 x 4.6 cm of heterogeneous mixed echogenicity. There is internal vascularity. Immediately adjacent to this adnexal mass within the right ovary is ankle 4.3 x 4.2 x 4.3 cm are complex cyst with lacy internal echoes and internal septations. The left ovary is not visualized. No definite pelvic free fluid.  IMPRESSION: 1. No intrauterine gestation. 2. Complex right adnexal mass measuring up to 8 cm. In the setting of positive pregnancy test and beta HCG 6063, this is concerning for right adnexal ectopic pregnancy, however not definitive. No  definite gestational sac or fetal pole is not seen within the adnexal mass. 3. Complex cyst in the right ovary measuring 4.3 cm. This is adjacent to the complex right adnexal mass. This may reflect a hemorrhagic cyst or endometrioma. These results were called by telephone at the time of interpretation on 04/04/2015 at 1:22 am to PA [facility], who verbally acknowledged these results.   Electronically Signed   By: Rubye Oaks M.D.   On: 04/04/2015 01:23    MAU Course  Procedures  MDM  2135: D/W Dr. Shawnie Pons. She will come discuss surgical intervention with the patient.  2147: Dr. Shawnie Pons on the unit to see the patient.   Assessment and Plan  Ectopic pregnancy To OR with Dr. Lanell Matar, Franco Collet 04/05/2015, 8:27 PM

## 2015-04-05 NOTE — Op Note (Signed)
Kimberly Carson  PROCEDURE DATE: 04/05/2015  PREOPERATIVE DIAGNOSIS: Ruptured ectopic pregnancy, left ovarian cyst  POSTOPERATIVE DIAGNOSIS: Ruptured left fallopian tube ectopic pregnancy, Fitz-Hugh Curtis  PROCEDURE: Laparoscopic left salpingectomy, drainage of left ovarian cyst   SURGEON:  Dr. Tinnie Gens  ASSISTANT: None  ANESTHESIOLOGIST: Mal Amabile, MD  INDICATIONS: 34 y.o. G2P0010 at [redacted]w[redacted]d here for with ruptured ectopic pregnancy with blood type O pos. Patient was counseled regarding need for laparoscopic salpingectomy. Risks of surgery including bleeding which may require transfusion or reoperation, infection, injury to bowel or other surrounding organs, need for additional procedures including laparotomy and other postoperative/anesthesia complications were explained to patient.  Written informed consent was obtained.  FINDINGS:  Small amount of hemoperitoneum estimated to be about 150cc of blood and clots.  Dilated left fallopian tube containing ectopic gestation. Large cystic mass on left ovary, drained of serous fluid, Fitz-Hugh Curtis findings at liver edge, large clot/tissue aborting out the end of the left tube. Adhesions of right tube and ovary to pelvic side wall.  ANESTHESIA: General  SPECIMENS: left fallopian tube to pathology and clot and tissue  COMPLICATIONS: None immediate  PROCEDURE IN DETAIL:  The patient was taken to the operating room where general anesthesia was administered and was found to be adequate.  She was placed in the dorsal lithotomy position, and was prepped and draped in a sterile manner.  A Foley catheter was inserted into her bladder and attached to constant drainage and a uterine manipulator was then advanced into the uterus .  After an adequate timeout was performed, attention was then turned to the patient'Carson abdomen where a 10-mm skin incision was made in the umbilicus vertically. Fascia and peritoneum were entered sharply. One 0 Vicryl suture was  used to tag the fascia circumferentially.  An 11 mm bladeless trocar was placed. The laparoscope was introduced.  A survey of the patient'Carson pelvis and abdomen revealed the findings as above. Two left lower quadrant ports were placed under direct visualization. Irrigation was used to determine the location of the ectopic.  The right tube, while adherent appeared thin throughout. The left tube was adherent to the enlarged left ovary but had a dilated portion noted close to the fimbria with adherent clot and tissue and bleeding from the end of the tube. It appeared to be the tube with the ectopic present in it. Attention was then turned to the left fallopian tube which was grasped and ligated from the underlying mesosalpinx and uterine attachment using the Harmonic Scalpel.  Good hemostasis was noted.  The specimen was placed in an EndoCatch bag and removed from the abdomen intact.  The abdomen was desufflated, and all instruments were removed.  The umbilicus incision was closed with the afore mentioned Vicryl suture; and all skin incisions were closed with a 3-0 Vicryl subcuticular stitch and Liqui-Band. The Hulka tenaculum was removed from the uterus. The patient tolerated the procedure well.  All instruments, needles, and sponge counts were correct x 2. The patient was taken to the recovery room in stable condition.   Kimberly Koehler S MD 04/05/2015 11:56 PM

## 2015-04-05 NOTE — Anesthesia Preprocedure Evaluation (Signed)
Anesthesia Evaluation  Patient identified by MRN, date of birth, ID band Patient awake    Reviewed: Allergy & Precautions, NPO status , Patient's Chart, lab work & pertinent test results  Airway Mallampati: II  TM Distance: >3 FB Neck ROM: Full    Dental no notable dental hx. (+) Teeth Intact   Pulmonary Current Smoker,  breath sounds clear to auscultation  Pulmonary exam normal       Cardiovascular negative cardio ROS Normal cardiovascular examRhythm:Regular Rate:Normal     Neuro/Psych negative neurological ROS  negative psych ROS   GI/Hepatic negative GI ROS, Neg liver ROS,   Endo/Other  negative endocrine ROS  Renal/GU negative Renal ROS  negative genitourinary   Musculoskeletal negative musculoskeletal ROS (+)   Abdominal   Peds  Hematology negative hematology ROS (+)   Anesthesia Other Findings   Reproductive/Obstetrics (+) Pregnancy Ruptured ectopic pregnancy                             Anesthesia Physical Anesthesia Plan  ASA: II and emergent  Anesthesia Plan: General   Post-op Pain Management:    Induction: Intravenous, Rapid sequence and Cricoid pressure planned  Airway Management Planned: Oral ETT  Additional Equipment:   Intra-op Plan:   Post-operative Plan: Extubation in OR  Informed Consent: I have reviewed the patients History and Physical, chart, labs and discussed the procedure including the risks, benefits and alternatives for the proposed anesthesia with the patient or authorized representative who has indicated his/her understanding and acceptance.   Dental advisory given  Plan Discussed with: Anesthesiologist, Surgeon and CRNA  Anesthesia Plan Comments:         Anesthesia Quick Evaluation

## 2015-04-05 NOTE — Anesthesia Procedure Notes (Signed)
Procedure Name: Intubation Date/Time: 04/05/2015 10:48 PM Performed by: Renford Dills Pre-anesthesia Checklist: Patient identified, Emergency Drugs available, Suction available and Patient being monitored Patient Re-evaluated:Patient Re-evaluated prior to inductionOxygen Delivery Method: Circle system utilized Preoxygenation: Pre-oxygenation with 100% oxygen Intubation Type: IV induction, Cricoid Pressure applied and Rapid sequence Laryngoscope Size: Miller and 2 Grade View: Grade I Tube type: Oral Tube size: 7.0 mm Number of attempts: 1 Airway Equipment and Method: Stylet Placement Confirmation: ETT inserted through vocal cords under direct vision,  positive ETCO2 and breath sounds checked- equal and bilateral Secured at: 18 cm Tube secured with: Tape Dental Injury: Teeth and Oropharynx as per pre-operative assessment

## 2015-04-05 NOTE — H&P (Signed)
  Kimberly Carson is an 34 y.o. [id number] [redacted]w[redacted]d female.   Chief Complaint: abdominal pain HPI: Pt. Here for repeat BHCG. BHCG is high and not appropriately rising and pelvic sono shows 8 cm pelvic mass, with complex fluid c/w rupture.  Pain x 5 days with increasing pain.  Blood type is O pos. Last ate at 7 pm. Pain is 10/10 and in the RLQ. Pelvic sono also shows left complex adnexal mass.  History reviewed. No pertinent past medical history.  Past Surgical History  Procedure Laterality Date  . Tonsillectomy    . Cholecystectomy      Family History  Problem Relation Age of Onset  . Hypertension Other    Social History:  reports that she has been smoking Cigarettes.  She does not have any smokeless tobacco history on file. She reports that she drinks alcohol. She reports that she does not use illicit drugs.  Allergies:  Allergies  Allergen Reactions  . Shellfish Allergy Hives  . Sulfa Antibiotics Hives    No current facility-administered medications on file prior to encounter.   Current Outpatient Prescriptions on File Prior to Encounter  Medication Sig Dispense Refill  . loperamide (IMODIUM) 2 MG capsule Take 2 mg by mouth as needed for diarrhea or loose stools.    . ondansetron (ZOFRAN ODT) 4 MG disintegrating tablet Take 1 tablet (4 mg total) by mouth every 8 (eight) hours as needed for nausea. 10 tablet 0  . oxyCODONE-acetaminophen (PERCOCET/ROXICET) 5-325 MG per tablet Take 1 tablet by mouth every 6 (six) hours as needed for severe pain. 10 tablet 0  . metroNIDAZOLE (METROGEL VAGINAL) 0.75 % vaginal gel Place 1 Applicatorful vaginally 2 (two) times daily. (Patient not taking: Reported on 04/05/2015) 70 g 0  . Prenatal Vit-Fe Fumarate-FA (PRENATAL COMPLETE) 14-0.4 MG TABS Take 1 tablet by mouth daily. (Patient not taking: Reported on 04/05/2015) 60 each 0    A comprehensive review of systems was negative except for: in the HPI  Blood pressure 102/60, pulse 60, temperature 99.1 F (37.3  C), temperature source Oral, last menstrual period 03/24/2015. General appearance: alert, cooperative and appears stated age Head: Normocephalic, without obvious abnormality, atraumatic Eyes: sclera without icterus Neck: supple, symmetrical, trachea midline Lungs: normal effort Heart: regular rate and rhythm Abdomen: soft, non-tender; bowel sounds normal; no masses,  no organomegaly Extremities: extremities normal, atraumatic, no cyanosis or edema Skin: Skin color, texture, turgor normal. No rashes or lesions Neurologic: Grossly normal   Lab Results  Component Value Date   WBC 10.1 04/03/2015   HGB 12.4 04/03/2015   HCT 37.2 04/03/2015   MCV 87.5 04/03/2015   PLT 322 04/03/2015   BHCG 7458 Pelvic sono Persistent complex right adnexal mass concerning for ectopic pregnancy. New complex pelvic free fluid suggest rupture.  Persistent complex left ovarian cyst Assessment/Plan Principal Problem:   Ruptured ectopic pregnancy  For laparoscopic removal. Risks include but are not limited to bleeding, infection, injury to surrounding structures, including bowel, bladder and ureters, blood clots, and death.  Likelihood of success is high.   Shaquira Moroz S 04/05/2015, 9:54 PM

## 2015-04-06 DIAGNOSIS — O009 Ectopic pregnancy, unspecified: Secondary | ICD-10-CM

## 2015-04-06 DIAGNOSIS — IMO0002 Reserved for concepts with insufficient information to code with codable children: Secondary | ICD-10-CM | POA: Diagnosis present

## 2015-04-06 MED ORDER — OXYCODONE-ACETAMINOPHEN 5-325 MG PO TABS
1.0000 | ORAL_TABLET | Freq: Four times a day (QID) | ORAL | Status: AC | PRN
Start: 2015-04-06 — End: ?

## 2015-04-06 MED ORDER — OXYCODONE-ACETAMINOPHEN 5-325 MG PO TABS
1.0000 | ORAL_TABLET | Freq: Once | ORAL | Status: AC
  Administered 2015-04-06: 1 via ORAL

## 2015-04-06 MED ORDER — OXYCODONE-ACETAMINOPHEN 5-325 MG PO TABS
ORAL_TABLET | ORAL | Status: AC
  Filled 2015-04-06: qty 1

## 2015-04-06 NOTE — Discharge Instructions (Signed)
Diagnostic Laparoscopy Laparoscopy is a surgical procedure. It is used to diagnose and treat diseases inside the belly (abdomen). It is usually a brief, common, and relatively simple procedure. The laparoscopeis a thin, lighted, pencil-sized instrument. It is like a telescope. It is inserted into your abdomen through a small cut (incision). Your caregiver can look at the organs inside your body through this instrument. He or she can see if there is anything abnormal. Laparoscopy can be done either in a hospital or outpatient clinic. You may be given a mild sedative to help you relax before the procedure. Once in the operating room, you will be given a drug to make you sleep (general anesthesia). Laparoscopy usually lasts less than 1 hour. After the procedure, you will be monitored in a recovery area until you are stable and doing well. Once you are home, it will take 2 to 3 days to fully recover. RISKS AND COMPLICATIONS  Laparoscopy has relatively few risks. Your caregiver will discuss the risks with you before the procedure. Some problems that can occur include:  Infection.  Bleeding.  Damage to other organs.  Anesthetic side effects. PROCEDURE Once you receive anesthesia, your surgeon inflates the abdomen with a harmless gas (carbon dioxide). This makes the organs easier to see. The laparoscope is inserted into the abdomen through a small incision. This allows your surgeon to see into the abdomen. Other small instruments are also inserted into the abdomen through other small openings. Many surgeons attach a video camera to the laparoscope to enlarge the view. During a diagnostic laparoscopy, the surgeon may be looking for inflammation, infection, or cancer. Your surgeon may take tissue samples(biopsies). The samples are sent to a specialist in looking at cells and tissue samples (pathologist). The pathologist examines them under a microscope. Biopsies can help to diagnose or confirm a  disease. AFTER THE PROCEDURE   The gas is released from inside the abdomen.  The incisions are closed with stitches (sutures). Because these incisions are small (usually less than 1/2 inch), there is usually minimal discomfort after the procedure. There may be some mild discomfort in the throat. This is from the tube placed in the throat while you were sleeping. You may have some mild abdominal discomfort. There may also be discomfort from the instrument placement incisions in the abdomen.  The recovery time is shortened as long as there are no complications.  You will rest in a recovery room until stable and doing well. As long as there are no complications, you may be allowed to go home. FINDING OUT THE RESULTS OF YOUR TEST Not all test results are available during your visit. If your test results are not back during the visit, make an appointment with your caregiver to find out the results. Do not assume everything is normal if you have not heard from your caregiver or the medical facility. It is important for you to follow up on all of your test results. HOME CARE INSTRUCTIONS   Take all medicines as directed.  Only take over-the-counter or prescription medicines for pain, discomfort, or fever as directed by your caregiver.  Resume daily activities as directed.  Showers are preferred over baths.  You may resume sexual activities in 1 week or as directed.  Do not drive while taking narcotics. SEEK MEDICAL CARE IF:   There is increasing abdominal pain.  There is new pain in the shoulders (shoulder strap areas).  You feel lightheaded or faint.  You have the chills.  You or  your child has an oral temperature above 102 F (38.9 C).  There is pus-like (purulent) drainage from any of the wounds.  You are unable to pass gas or have a bowel movement.  You feel sick to your stomach (nauseous) or throw up (vomit). MAKE SURE YOU:   Understand these instructions.  Will watch  your condition.  Will get help right away if you are not doing well or get worse. Document Released: 12/01/2000 Document Revised: 12/20/2012 Document Reviewed: 08/25/2007 [facility] Patient Information 2015 [place], [place]. This information is not intended to replace advice given to you by your health care provider. Make sure you discuss any questions you have with your health care provider. Unilateral Salpingectomy Unilateral salpingo-oophorectomy is the surgical removal of one fallopian tube. The ovaries are small organs that produce eggs in women. The fallopian tubes transport the egg from the ovary to the womb (uterus). A unilateral salpingectomy may be done for various reasons, including:  Infection in the fallopian tube and ovary.  Scar tissue in the fallopian tube and ovary (adhesions).  A cyst or tumor on the ovary.  A need to remove the fallopian tube and ovary when removing the uterus.  Cancer of the fallopian tube or ovary.  Ectopic pregnancy The removal of one fallopian tube will not prevent you from becoming pregnant, put you into menopause, or cause problems with your menstrual periods or sex drive. LET [facility] CARE PROVIDER KNOW ABOUT:  Any allergies you have.  All medicines you are taking, including vitamins, herbs, eye drops, creams, and over-the-counter medicines.  Previous problems you or members of your family have had with the use of anesthetics.  Any blood disorders you have.  Previous surgeries you have had.  Medical conditions you have. RISKS AND COMPLICATIONS  Generally, this is a safe procedure. However, as with any procedure, complications can occur. Possible complications include:  Injury to surrounding organs.  Bleeding.  Infection.  Blood clots in the legs or lungs.  Problems related to anesthesia. BEFORE THE PROCEDURE  Ask your health care provider about changing or stopping your regular medicines. You may need to stop taking certain  medicines, such as aspirin or blood thinners, at least 1 week before the surgery.  Do not eat or drink anything for at least 8 hours before the surgery.  If you smoke, do not smoke for at least 2 weeks before the surgery.  Make plans to have someone drive you home after the procedure or after your hospital stay. Also arrange for someone to help you with activities during recovery. PROCEDURE  You will be given medicine to help you relax before the procedure (sedative). You will then be given medicine to make you sleep through the procedure (general anesthetic). These medicines will be given through an IV access tube that is put into one of your veins.  Once you are asleep, your lower abdomen will be shaved and cleaned. A thin, flexible tube (catheter) will be placed in your bladder.  The surgeon may use a laparoscopic, robotic, or open technique for this surgery:  In the laparoscopic technique, the surgery is done through two small cuts (incisions) in the abdomen. A thin, lighted tube with a tiny camera on the end (laparoscope) is inserted into one of the incisions. The tools needed for the procedure are put through the other incision.  A robotic technique may be chosen to perform complex surgery in a small space. In the robotic technique, small incisions are made. A camera  and surgical instruments are passed through the incisions. Surgical instruments are controlled with the help of a robotic arm.  In the open technique, the surgery is done through one large incision in the abdomen.  Using any of these techniques, the surgeon will remove the fallopian tube and ovary. The blood vessels will be clamped and tied.  The surgeon will then use staples or stitches to close the incision or incisions. AFTER THE PROCEDURE  You will be taken to a recovery area where your progress will be monitored for 1-3 hours. Your blood pressure, pulse, and temperature will be checked often. You will remain in the  recovery area until you are stable and waking up.  If the laparoscopic technique was used, you may be allowed to go home after several hours. You may have some shoulder pain. This is normal and usually goes away in a day or two.  If the open technique was used, you will be admitted to the hospital for a couple of days.  You will be given pain medicine as necessary.  The IV tube and catheter will be removed before you are discharged. Document Released: 06/22/2009 Document Revised: 08/30/2013 Document Reviewed: 02/16/2013 [facility] Patient Information 2015 [place], [place]. This information is not intended to replace advice given to you by your health care provider. Make sure you discuss any questions you have with your health care provider.

## 2015-04-06 NOTE — Transfer of Care (Signed)
Immediate Anesthesia Transfer of Care Note  Patient: Kimberly Carson  Procedure(s) Performed: Procedure(s): DIAGNOSTIC LAPAROSCOPY WITH LEFT SALPINGECTOMY (N/A)  Patient Location: PACU  Anesthesia Type:General  Level of Consciousness: awake and sedated  Airway & Oxygen Therapy: Patient Spontanous Breathing and Patient connected to nasal cannula oxygen  Post-op Assessment: Report given to RN and Post -op Vital signs reviewed and stable  Post vital signs: stable  Last Vitals:  Filed Vitals:   04/05/15 2134  BP: 102/60  Pulse: 60  Temp: 37.3 C    Complications: No apparent anesthesia complications

## 2015-04-06 NOTE — Addendum Note (Signed)
Addendum  created 04/06/15 1121 by Fabienne Bruns, RN   Modules edited: Charges VN

## 2015-04-06 NOTE — Addendum Note (Signed)
Addendum  created 04/06/15 1137 by Fabienne Bruns, RN   Modules edited: Charges VN

## 2015-04-06 NOTE — Anesthesia Postprocedure Evaluation (Signed)
  Anesthesia Post-op Note  Patient: Kimberly Carson  Procedure(s) Performed: Procedure(s): DIAGNOSTIC LAPAROSCOPY WITH LEFT SALPINGECTOMY (N/A)  Patient Location: PACU  Anesthesia Type:General  Level of Consciousness: awake, alert  and oriented  Airway and Oxygen Therapy: Patient Spontanous Breathing  Post-op Pain: mild  Post-op Assessment: Post-op Vital signs reviewed, Patient's Cardiovascular Status Stable, Respiratory Function Stable, Patent Airway, No signs of Nausea or vomiting and Pain level controlled              Post-op Vital Signs: Reviewed and stable  Last Vitals:  Filed Vitals:   04/06/15 0100  BP: 99/57  Pulse: 64  Temp:   Resp: 18    Complications: No apparent anesthesia complications

## 2015-04-06 NOTE — Addendum Note (Signed)
Addendum  created 04/06/15 1133 by Fabienne Bruns, RN   Modules edited: Charges VN

## 2015-04-06 NOTE — Addendum Note (Signed)
Addendum  created 04/06/15 1126 by Randa Spike, CRNA   Modules edited: Charges VN

## 2015-04-06 NOTE — Addendum Note (Signed)
Addendum  created 04/06/15 1110 by Fabienne Bruns, RN   Modules edited: Charges VN

## 2015-04-08 ENCOUNTER — Encounter (HOSPITAL_COMMUNITY): Payer: Self-pay | Admitting: Family Medicine

## 2015-10-09 ENCOUNTER — Other Ambulatory Visit: Payer: Self-pay | Admitting: Obstetrics and Gynecology

## 2015-10-09 ENCOUNTER — Other Ambulatory Visit (HOSPITAL_COMMUNITY)
Admission: RE | Admit: 2015-10-09 | Discharge: 2015-10-09 | Disposition: A | Discharge status: Home / Self Care | Payer: BLUE CROSS/BLUE SHIELD | Source: Ambulatory Visit | Attending: Obstetrics and Gynecology | Admitting: Obstetrics and Gynecology

## 2015-10-09 DIAGNOSIS — Z01419 Encounter for gynecological examination (general) (routine) without abnormal findings: Secondary | ICD-10-CM | POA: Insufficient documentation

## 2015-10-09 DIAGNOSIS — Z113 Encounter for screening for infections with a predominantly sexual mode of transmission: Secondary | ICD-10-CM | POA: Insufficient documentation

## 2015-10-09 DIAGNOSIS — Z1151 Encounter for screening for human papillomavirus (HPV): Secondary | ICD-10-CM | POA: Insufficient documentation

## 2015-10-11 LAB — CYTOLOGY - PAP

## 2016-01-12 IMAGING — US US OB TRANSVAGINAL
1 series · 13 of 28 positions shown · non-contrast
Comparison: 04/04/2015

CLINICAL DATA: Right lower quadrant pelvic pain, possible ectopic,
beta HCG 7,458

EXAM:
TRANSVAGINAL OB ULTRASOUND
TECHNIQUE: Transvaginal ultrasound was performed for complete evaluation of the
gestation as well as the maternal uterus, adnexal regions, and
pelvic cul-de-sac.

[Series 1: us ob transvaginal · 53 acquisitions, 13 frames shown]
[im 2/53]
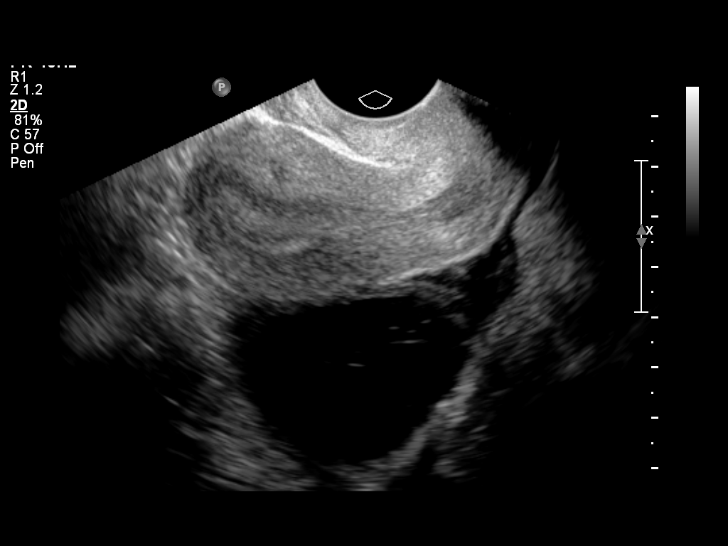
[im 6/53]
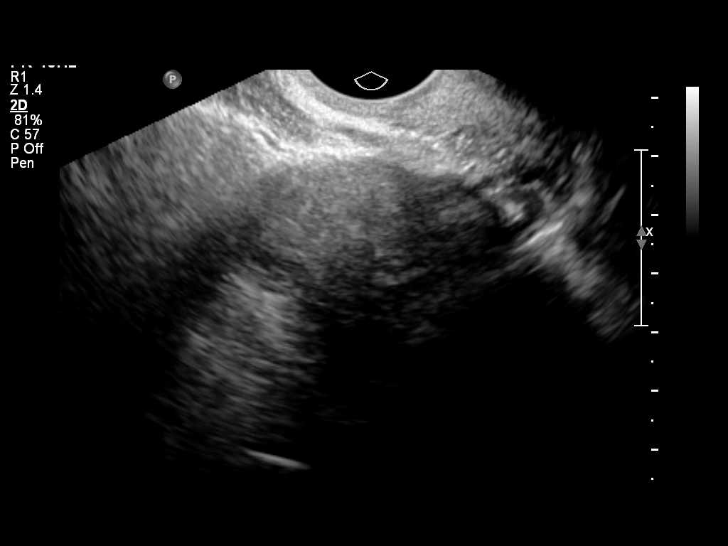
[im 10/53]
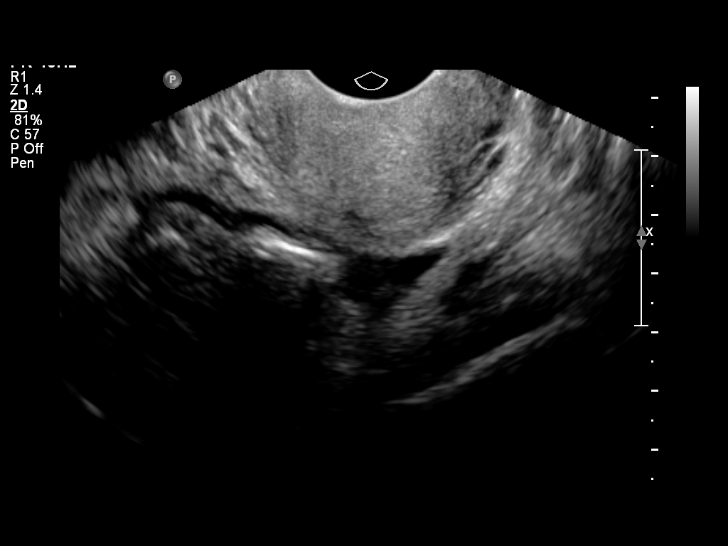
[im 14/53]
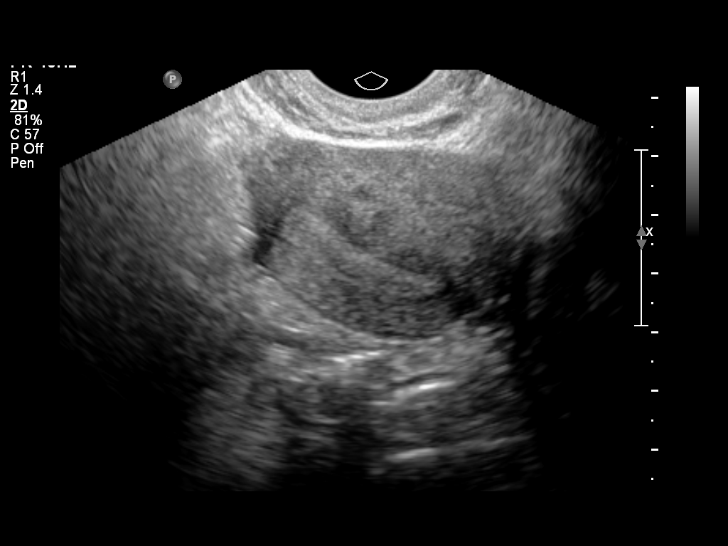
[im 18/53]
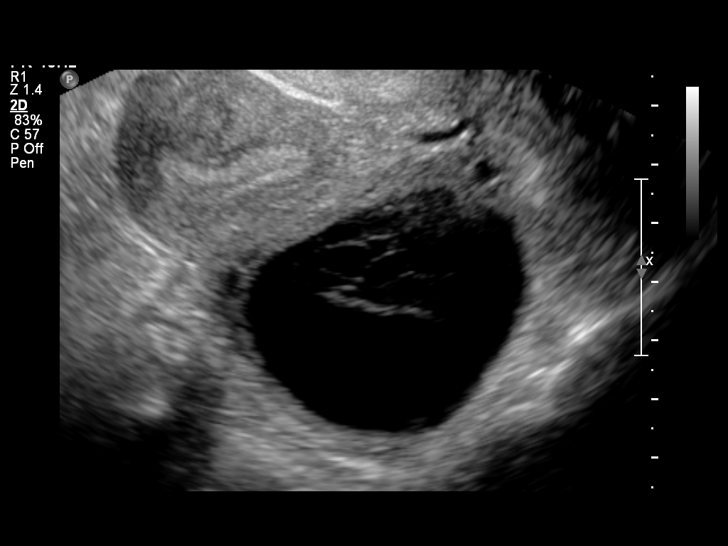
[im 22/53]
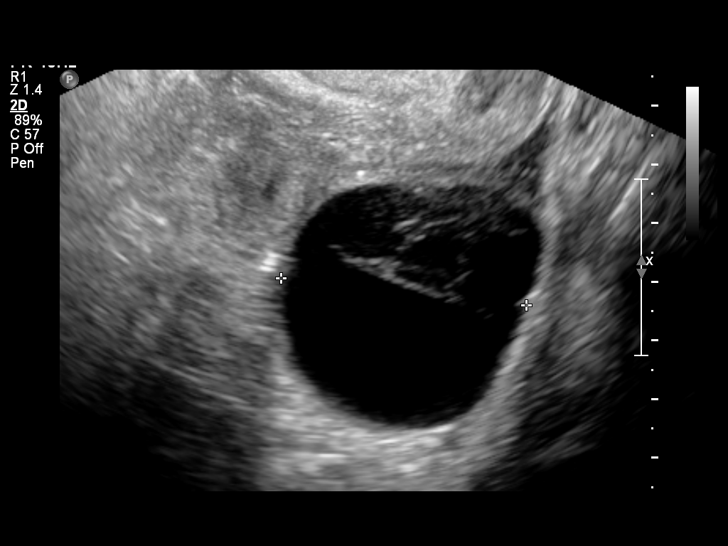
[im 27/53]
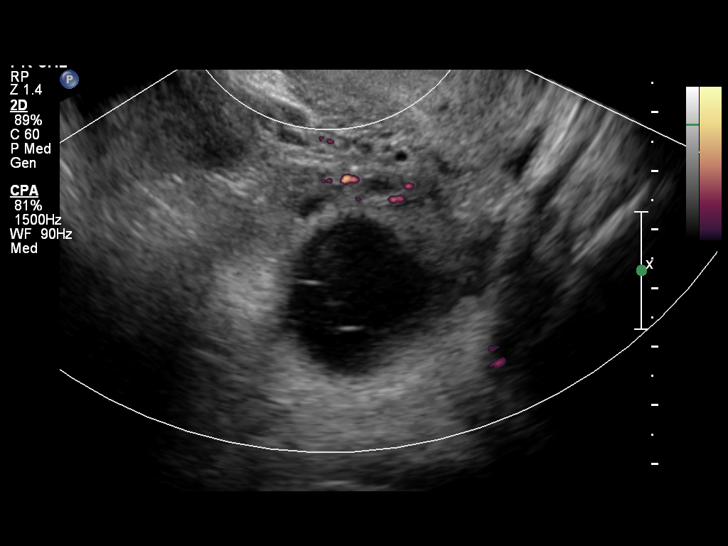
[im 31/53]
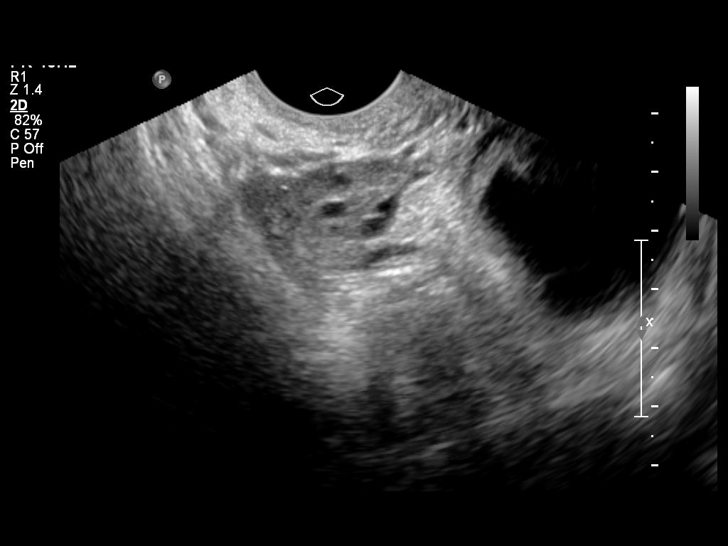
[im 35/53]
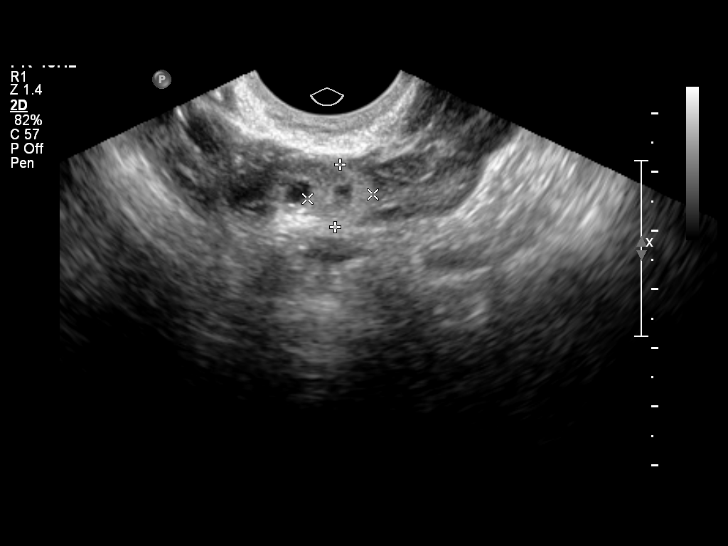
[im 39/53]
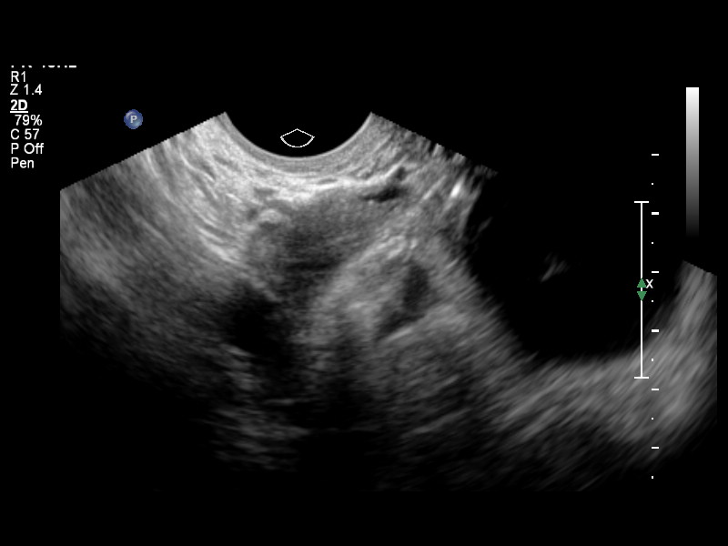
[im 43/53]
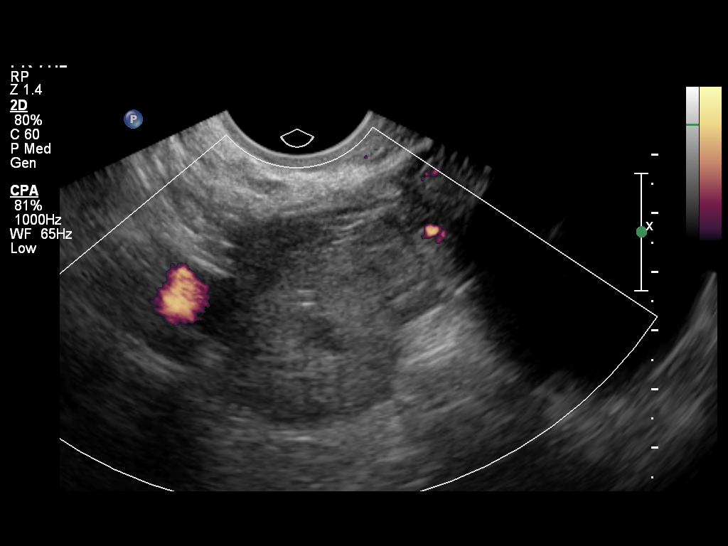
[im 47/53]
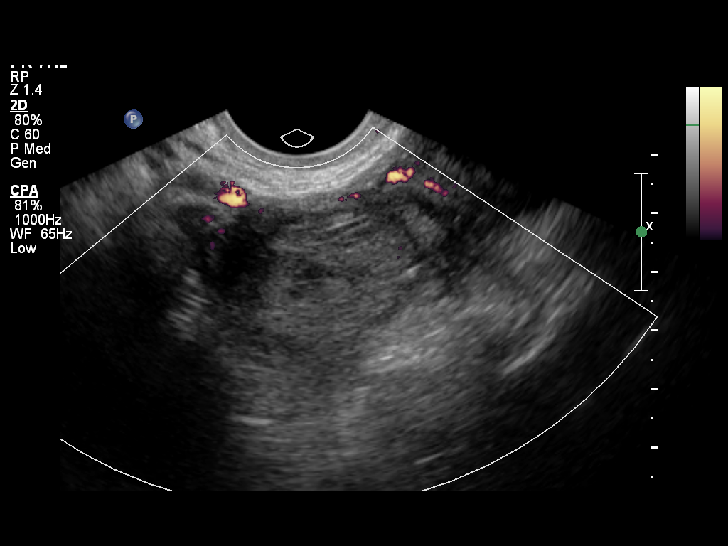
[im 51/53]
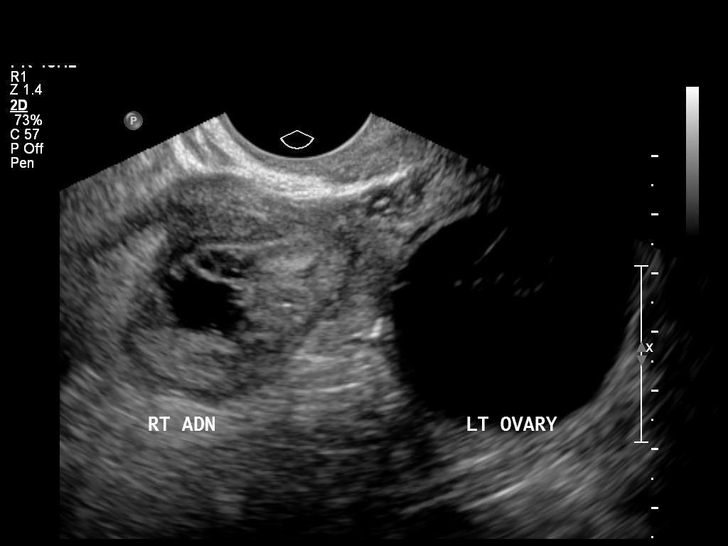

[13 of 28 positions shown; findings below may reference images not displayed]

FINDINGS: Intrauterine gestational sac: Not visualized

Yolk sac:  Not visualized

Embryo:  Not visualized

Cardiac Activity: Not detected

Maternal uterus/adnexae: Uterus is normal in size. No intrauterine
pregnancy demonstrated. Normal appearing endometrium. Persistent
complex right adnexal mass measuring 4.4 x 3.4 x 3.4 cm with
internal irregular hypoechoic fluid. No significant surrounding
hypervascularity. Interval development of a small amount of complex
pelvic free fluid. Appearance remains concerning for a right ectopic
pregnancy . The new complex pelvic free fluid is concerning for
rupture.

Left ovary contains a complex septated cyst measuring 5 x 3.7 x
cm.
IMPRESSION: No intrauterine pregnancy visualized.

Persistent complex right adnexal mass concerning for ectopic
pregnancy. New complex pelvic free fluid suggest rupture.

Persistent complex left ovarian cyst

These results were called by telephone at the time of interpretation
on 04/05/2015 at [DATE] to. JEAN WISLY DELSOIN , who verbally
acknowledged these results.

## 2016-02-07 ENCOUNTER — Encounter (HOSPITAL_COMMUNITY): Payer: Self-pay | Admitting: *Deleted

## 2016-11-06 IMAGING — US US OB COMP LESS 14 WK
1 series · 13 of 28 positions shown · non-contrast
Comparison: None.

CLINICAL DATA: Pregnant patient with pain and vaginal bleeding.
Beta HCG 7576.

EXAM:
OBSTETRIC <14 WK US AND TRANSVAGINAL OB US
TECHNIQUE: Both transabdominal and transvaginal ultrasound examinations were
performed for complete evaluation of the gestation as well as the
maternal uterus, adnexal regions, and pelvic cul-de-sac.
Transvaginal technique was performed to assess early pregnancy.

[Series 1: us ob comp less 14 wk · 0.21mm/px · 62 acquisitions, 13 frames shown]
[im 3/62]
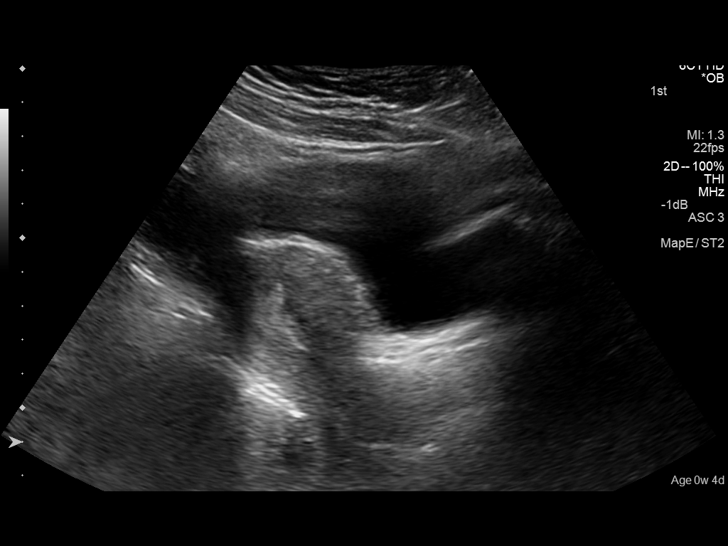
[im 7/62]
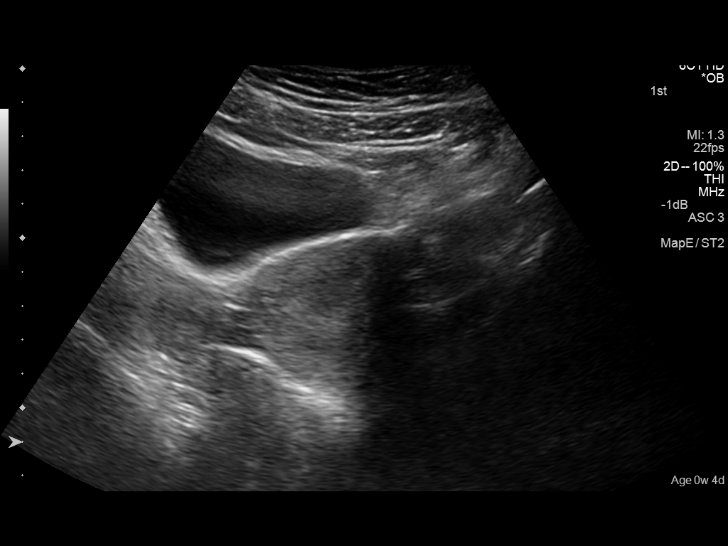
[im 12/62]
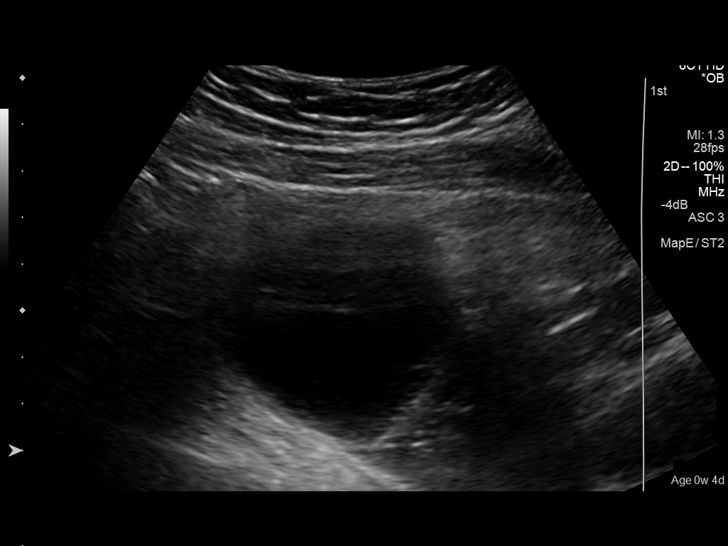
[im 16/62]
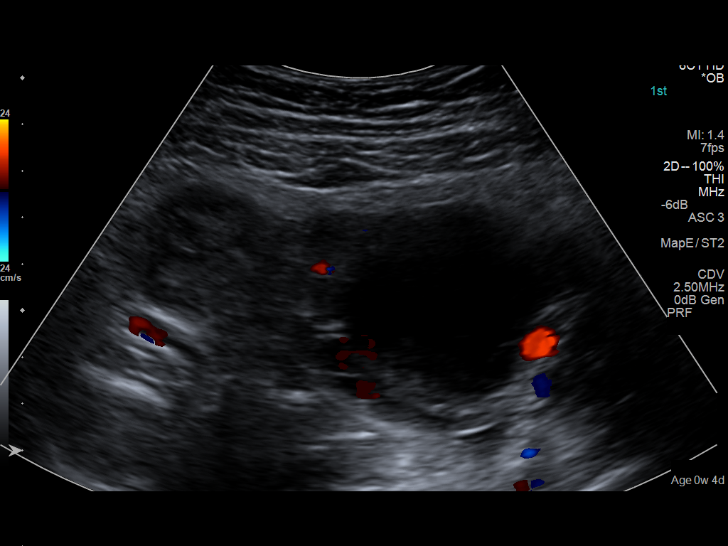
[im 21/62]
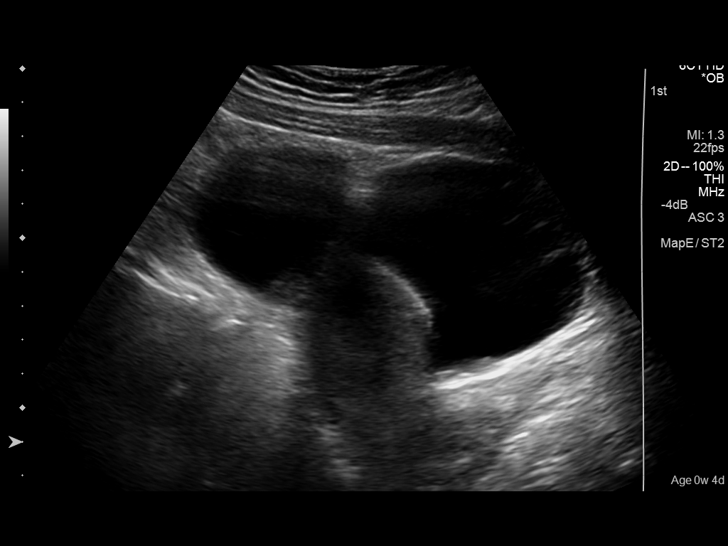
[im 25/62]
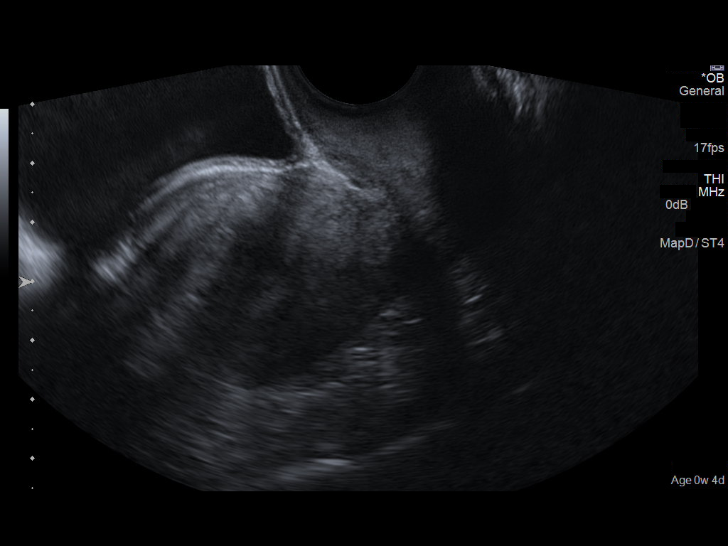
[im 32/62]
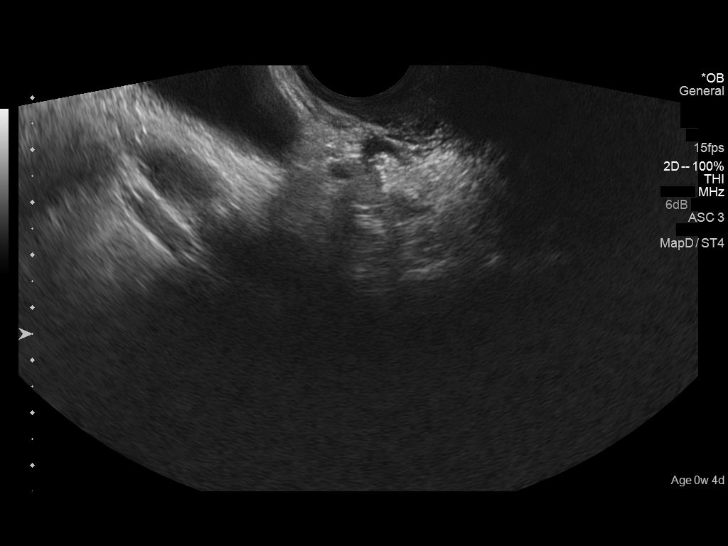
[im 37/62]
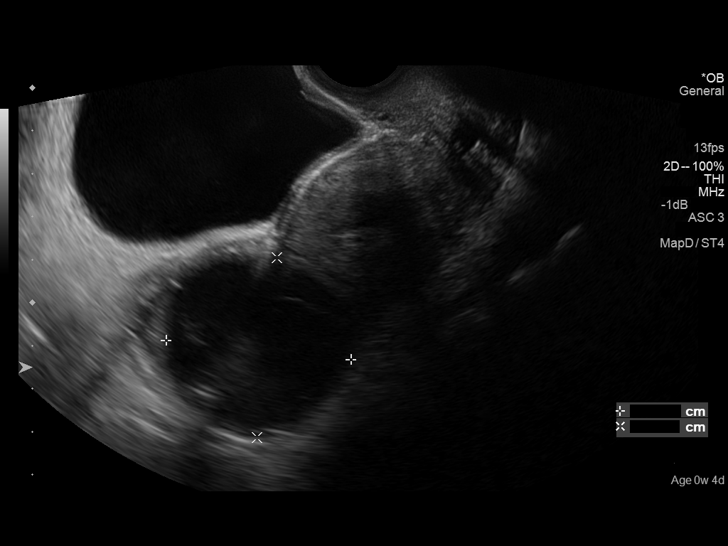
[im 41/62]
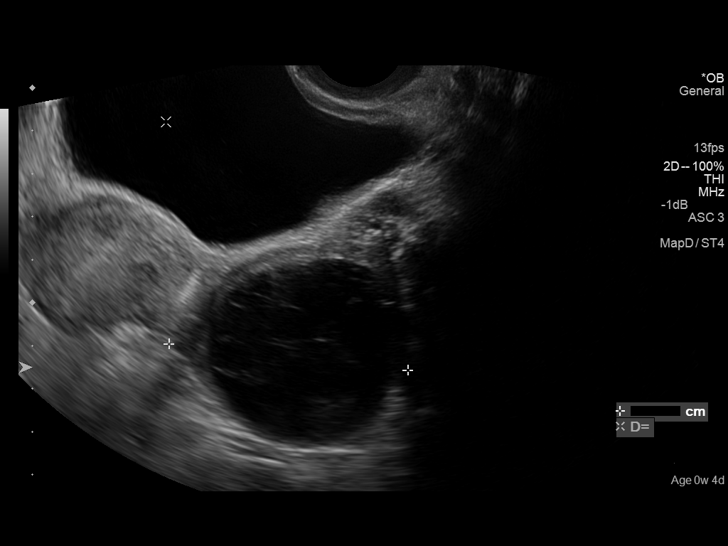
[im 46/62]
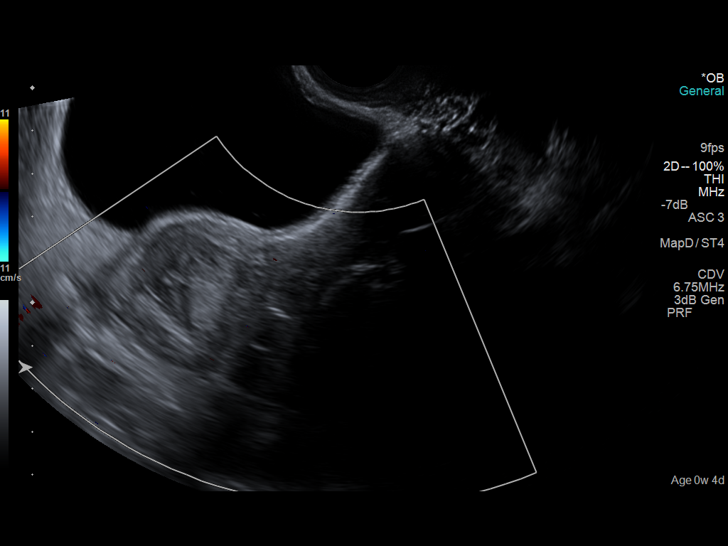
[im 50/62]
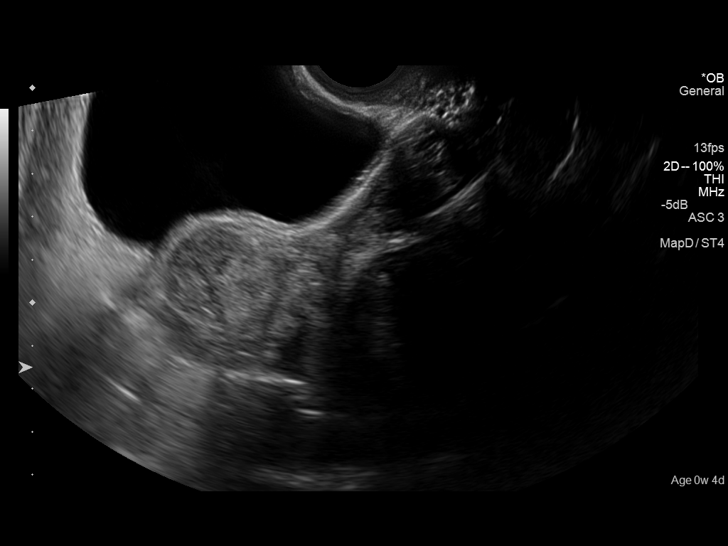
[im 55/62]
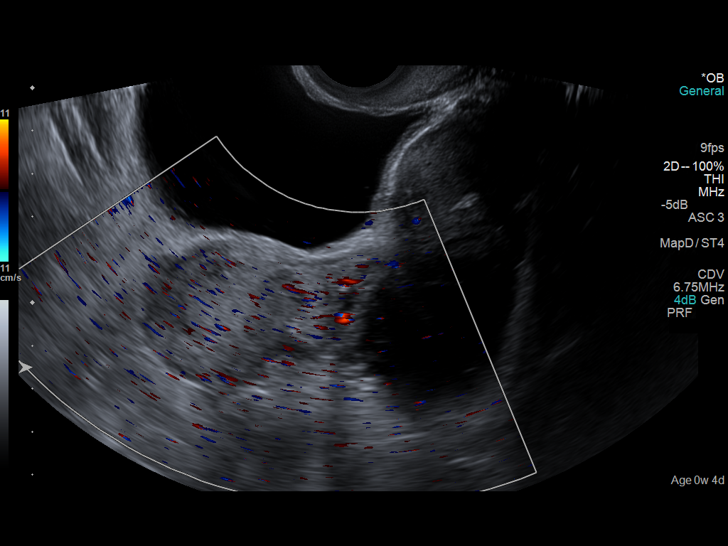
[im 59/62]
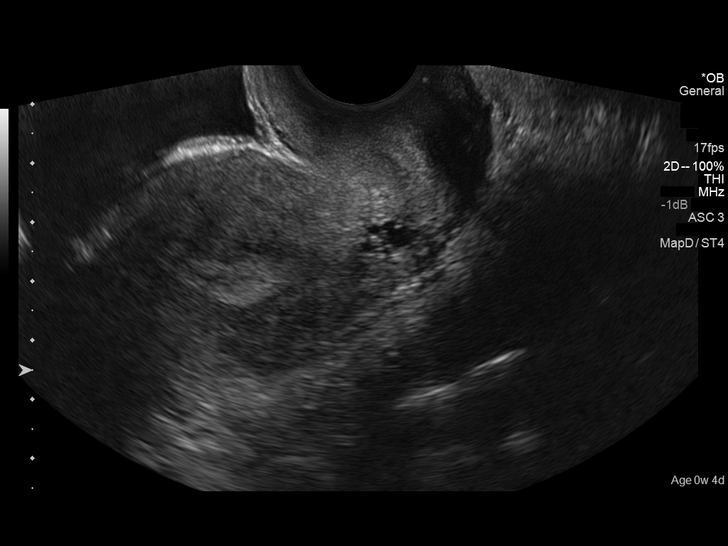

[13 of 28 positions shown; findings below may reference images not displayed]

FINDINGS: Intrauterine gestational sac: Not present.

Yolk sac:  Not present.

Embryo:  Not present

Maternal uterus/adnexae: The uterus measures 6.5 x 3.8 x 4.4 cm.
Endometrium measures 9.3 mm. There is a complex right adnexal mass
measuring 6.8 x 8.3 x 4.6 cm of heterogeneous mixed echogenicity.
There is internal vascularity. Immediately adjacent to this adnexal
mass within the right ovary is ankle 4.3 x 4.2 x 4.3 cm are complex
cyst with Keyli internal echoes and internal septations. The left
ovary is not visualized. No definite pelvic free fluid.
IMPRESSION: 1. No intrauterine gestation.
2. Complex right adnexal mass measuring up to 8 cm. In the setting
of positive pregnancy test and beta HCG 7576, this is concerning for
right adnexal ectopic pregnancy, however not definitive. No definite
gestational sac or fetal pole is not seen within the adnexal mass.
3. Complex cyst in the right ovary measuring 4.3 cm. This is
adjacent to the complex right adnexal mass. This may reflect a
hemorrhagic cyst or endometrioma.
These results were called by telephone at the time of interpretation
on 04/04/2015 at [DATE] to PA Jen Pipepenbrink, who verbally
acknowledged these results.

## 2018-10-20 ENCOUNTER — Emergency Department (HOSPITAL_COMMUNITY): Admission: EM | Admit: 2018-10-20 | Payer: 59 | Source: Home / Self Care

## 2018-10-20 ENCOUNTER — Encounter (HOSPITAL_COMMUNITY): Payer: Self-pay | Admitting: *Deleted

## 2018-10-20 MED ORDER — SODIUM CHLORIDE 0.9% FLUSH: 3.0000 mL | Freq: Once | INTRAVENOUS | Status: DC

## 2018-10-20 NOTE — ED Notes (Signed)
Called pt in waiting room x5 for EKG and blood work, no response. Called XRAY dept. Stated doesn't have pt. Notified Emily(RN)

## 2018-10-20 NOTE — ED Triage Notes (Signed)
Pt in c/o chest pain and palpitations that started last night, history of same and has referral to cardiologist but has not seen them yet, no distress noted, also reports mild cough
